# Patient Record
Sex: Female | Born: 1990 | Race: Black or African American | Hispanic: No | Marital: Married | Smoking: Never smoker
Health system: Southern US, Community
[De-identification: ages and names within clinical notes are randomized; demographics above are authoritative.]

## PROBLEM LIST (undated history)

## (undated) DIAGNOSIS — O24419 Gestational diabetes mellitus in pregnancy, unspecified control: Secondary | ICD-10-CM

## (undated) DIAGNOSIS — I82409 Acute embolism and thrombosis of unspecified deep veins of unspecified lower extremity: Secondary | ICD-10-CM

## (undated) DIAGNOSIS — K439 Ventral hernia without obstruction or gangrene: Secondary | ICD-10-CM

## (undated) DIAGNOSIS — D649 Anemia, unspecified: Secondary | ICD-10-CM

## (undated) HISTORY — DX: Gestational diabetes mellitus in pregnancy, unspecified control: O24.419

## (undated) HISTORY — PX: WISDOM TOOTH EXTRACTION: SHX21

## (undated) HISTORY — PX: POLYPECTOMY: SHX149

---
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
## 2015-07-18 DIAGNOSIS — G43009 Migraine without aura, not intractable, without status migrainosus: Secondary | ICD-10-CM | POA: Insufficient documentation

## 2017-02-04 ENCOUNTER — Ambulatory Visit (INDEPENDENT_AMBULATORY_CARE_PROVIDER_SITE_OTHER): Payer: PRIVATE HEALTH INSURANCE | Admitting: Physician Assistant

## 2017-02-04 ENCOUNTER — Encounter: Payer: Self-pay | Admitting: Physician Assistant

## 2017-02-04 VITALS — BP 110/70 | HR 80 | Temp 98.0°F | Resp 18 | Ht 68.11 in | Wt 164.0 lb

## 2017-02-04 DIAGNOSIS — Z7689 Persons encountering health services in other specified circumstances: Secondary | ICD-10-CM | POA: Diagnosis not present

## 2017-02-04 DIAGNOSIS — Z87898 Personal history of other specified conditions: Secondary | ICD-10-CM

## 2017-02-04 NOTE — Patient Instructions (Addendum)
CBC - to check for anemia TSH - to check for thyroid problems CMET - to check your liver and kideys and electrolytes    IF you received an x-ray today, you will receive an invoice from [facility]. Please contact [facility] at [phone] with questions or concerns regarding your invoice.   IF you received labwork today, you will receive an invoice from [facility]. Please contact [facility] at [phone] with questions or concerns regarding your invoice.   Our billing staff will not be able to assist you with questions regarding bills from these companies.  You will be contacted with the lab results as soon as they are available. The fastest way to get your results is to activate your My Chart account. Instructions are located on the last page of this paperwork. If you have not heard from us regarding the results in 2 weeks, please contact this office.

## 2017-02-04 NOTE — Progress Notes (Signed)
   Tina ReedSarah Phillips  MRN: [id number] DOB: 06/24/1991  PCP: Patient, No Pcp Per  Chief Complaint  Patient presents with  . Establish Care    Subjective:  Pt presents to clinic to establish care.  She tried to give plasma in Jan and on 3 separate occasions she had tachycardia and was told by them that she had to be evaluated before she could give more plasma. She feels fine.  She has not tachycardia or palpitations that she is aware of.  During that time in jan she was under a lot of stress with the death of her sisters father.  She also recognizes that after she was told the 1st time her HR was elevated when she went the 2nd time she was upset that it might be high again and it was and then it happened a 3rd time.   Review of Systems  Patient Active Problem List   Diagnosis Date Noted  . Migraine without aura 07/18/2015    No current outpatient prescriptions on file prior to visit.   No current facility-administered medications on file prior to visit.     No Known Allergies  Pt patients past, family and social history were reviewed and updated.   Objective:  BP 110/70   Pulse 80   Temp 98 F (36.7 C) (Oral)   Resp 18   Ht 5' 8.11" (1.73 m)   Wt 164 lb (74.4 kg)   LMP 01/17/2017 (Approximate)   SpO2 98%   BMI 24.86 kg/m   Physical Exam  Constitutional: She is oriented to person, place, and time and well-developed, well-nourished, and in no distress.  HENT:  Head: Normocephalic and atraumatic.  Right Ear: Hearing and external ear normal.  Left Ear: Hearing and external ear normal.  Eyes: Conjunctivae are normal.  Neck: Normal range of motion.  Cardiovascular: Normal rate, regular rhythm and normal heart sounds.   No murmur heard. Pulmonary/Chest: Effort normal and breath sounds normal. She has no wheezes.  Neurological: She is alert and oriented to person, place, and time. Gait normal.  Skin: Skin is warm and dry.  Psychiatric: Mood, memory, affect and judgment  normal.  Vitals reviewed.   Assessment and Plan :  Encounter to establish care  History of tachycardia - pt HR is normal today - it was checked twice.  A note was written to plasma center - she will let me know if they will not accept this and we will do lab work at that time without an ov.    Benny LennertSarah Zylpha Poynor PA-C  Primary Care at [facility] 02/04/2017 4:40 PM

## 2017-03-07 ENCOUNTER — Encounter: Payer: PRIVATE HEALTH INSURANCE | Admitting: Physician Assistant

## 2017-04-01 ENCOUNTER — Encounter (HOSPITAL_COMMUNITY): Payer: Self-pay | Admitting: Emergency Medicine

## 2017-04-01 ENCOUNTER — Emergency Department (HOSPITAL_COMMUNITY)
Admission: EM | Admit: 2017-04-01 | Discharge: 2017-04-01 | Disposition: A | Discharge status: Home / Self Care | Payer: Self-pay | Attending: Emergency Medicine | Admitting: Emergency Medicine

## 2017-04-01 DIAGNOSIS — F10920 Alcohol use, unspecified with intoxication, uncomplicated: Secondary | ICD-10-CM | POA: Insufficient documentation

## 2017-04-01 DIAGNOSIS — Z79899 Other long term (current) drug therapy: Secondary | ICD-10-CM | POA: Insufficient documentation

## 2017-04-01 DIAGNOSIS — R1110 Vomiting, unspecified: Secondary | ICD-10-CM | POA: Insufficient documentation

## 2017-04-01 DIAGNOSIS — R4182 Altered mental status, unspecified: Secondary | ICD-10-CM | POA: Insufficient documentation

## 2017-04-01 DIAGNOSIS — K2920 Alcoholic gastritis without bleeding: Secondary | ICD-10-CM

## 2017-04-01 LAB — COMPREHENSIVE METABOLIC PANEL
ALBUMIN: 3.4 g/dL — AB (ref 3.5–5.0)
ALT: 46 U/L (ref 14–54)
ANION GAP: 7 (ref 5–15)
AST: 34 U/L (ref 15–41)
Alkaline Phosphatase: 32 U/L — ABNORMAL LOW (ref 38–126)
BILIRUBIN TOTAL: 0.2 mg/dL — AB (ref 0.3–1.2)
BUN: 11 mg/dL (ref 6–20)
CALCIUM: 7.5 mg/dL — AB (ref 8.9–10.3)
CO2: 21 mmol/L — ABNORMAL LOW (ref 22–32)
Chloride: 111 mmol/L (ref 101–111)
Creatinine, Ser: 0.8 mg/dL (ref 0.44–1.00)
GLUCOSE: 104 mg/dL — AB (ref 65–99)
POTASSIUM: 3.7 mmol/L (ref 3.5–5.1)
Sodium: 139 mmol/L (ref 135–145)
TOTAL PROTEIN: 6 g/dL — AB (ref 6.5–8.1)

## 2017-04-01 LAB — URINALYSIS, ROUTINE W REFLEX MICROSCOPIC
Bacteria, UA: NONE SEEN
Bilirubin Urine: NEGATIVE
GLUCOSE, UA: NEGATIVE mg/dL
HGB URINE DIPSTICK: NEGATIVE
KETONES UR: NEGATIVE mg/dL
Leukocytes, UA: NEGATIVE
NITRITE: NEGATIVE
PH: 6 (ref 5.0–8.0)
Protein, ur: NEGATIVE mg/dL
Specific Gravity, Urine: 1.01 (ref 1.005–1.030)

## 2017-04-01 LAB — RAPID URINE DRUG SCREEN, HOSP PERFORMED
Amphetamines: NOT DETECTED
BARBITURATES: NOT DETECTED
BENZODIAZEPINES: NOT DETECTED
Cocaine: NOT DETECTED
Opiates: NOT DETECTED
Tetrahydrocannabinol: NOT DETECTED

## 2017-04-01 LAB — I-STAT BETA HCG BLOOD, ED (MC, WL, AP ONLY): I-stat hCG, quantitative: <5 m[IU]/mL (ref <5)

## 2017-04-01 LAB — CBC WITH DIFFERENTIAL/PLATELET
BASOS PCT: 0 %
Basophils Absolute: 0 10*3/uL (ref 0.0–0.1)
Eosinophils Absolute: 0 10*3/uL (ref 0.0–0.7)
Eosinophils Relative: 0 %
HEMATOCRIT: 31.4 % — AB (ref 36.0–46.0)
Hemoglobin: 9.7 g/dL — ABNORMAL LOW (ref 12.0–15.0)
LYMPHS ABS: 1.3 10*3/uL (ref 0.7–4.0)
Lymphocytes Relative: 11 %
MCH: 24.6 pg — ABNORMAL LOW (ref 26.0–34.0)
MCHC: 30.9 g/dL (ref 30.0–36.0)
MCV: 79.5 fL (ref 78.0–100.0)
MONO ABS: 0.8 10*3/uL (ref 0.1–1.0)
MONOS PCT: 6 %
NEUTROS ABS: 9.6 10*3/uL — AB (ref 1.7–7.7)
Neutrophils Relative %: 83 %
Platelets: 218 10*3/uL (ref 150–400)
RBC: 3.95 MIL/uL (ref 3.87–5.11)
RDW: 16.9 % — AB (ref 11.5–15.5)
WBC: 11.7 10*3/uL — ABNORMAL HIGH (ref 4.0–10.5)

## 2017-04-01 LAB — ETHANOL: ALCOHOL ETHYL (B): 215 mg/dL — AB (ref <5)

## 2017-04-01 MED ORDER — FAMOTIDINE 20 MG PO TABS: 20.0000 mg | ORAL_TABLET | Freq: Two times a day (BID) | ORAL | 0 refills | Status: DC

## 2017-04-01 MED ORDER — SODIUM CHLORIDE 0.9 % IV BOLUS (SEPSIS)
1000.0000 mL | Freq: Once | INTRAVENOUS | Status: AC
  Administered 2017-04-01: 1000 mL via INTRAVENOUS

## 2017-04-01 MED ORDER — ONDANSETRON HCL 4 MG/2ML IJ SOLN
4.0000 mg | Freq: Once | INTRAMUSCULAR | Status: AC
  Administered 2017-04-01: 4 mg via INTRAVENOUS
  Filled 2017-04-01: qty 2

## 2017-04-01 NOTE — ED Notes (Signed)
soda and sandwich given to PT. RN at bedside

## 2017-04-01 NOTE — ED Provider Notes (Signed)
[facility]-EMERGENCY DEPT Provider Note   CSN: [id number] Arrival date & time: 04/01/17  0155     History   Chief Complaint Chief Complaint  Patient presents with  . Alcohol Intoxication    HPI Tina Phillips is a 26 y.o. female with unknown medical history presents to the emergency department via EMS with acute alcohol intoxication. EMS was called for intoxication. Patient had ambulated from the bar out to the parking lot where she began to vomit best prompting the 911 call. EMS reports that she was initially alert and oriented though slurring her words. Friends with the patient report no falls or trauma.  Per EMS, patient was able to ambulate with assistance to the stretcher where she sat down and vomited again.  After this, patient was largely unresponsive for EMS. They found her to be hypotensive and initiated fluid bolus.  The history is provided by the EMS personnel and medical records. The history is limited by the condition of the patient. No language interpreter was used.    History reviewed. No pertinent past medical history.  Patient Active Problem List   Diagnosis Date Noted  . Migraine without aura 07/18/2015    History reviewed. No pertinent surgical history.  OB History    No data available       Home Medications    Prior to Admission medications   Medication Sig Start Date End Date Taking? Authorizing Provider  Aspirin-Acetaminophen-Caffeine (GOODYS EXTRA STRENGTH) [phone] MG PACK Take 1 each by mouth as needed (headache).   Yes [provider]  Biotin 10 MG CAPS Take 1 capsule by mouth daily.    Yes [provider]  famotidine (PEPCID) 20 MG tablet Take 1 tablet (20 mg total) by mouth 2 (two) times daily. 04/01/17   Valda Christenson, Dahlia Client, PA-C    Family History Family History  Problem Relation Age of Onset  . Diabetes Mother   . Diabetes Father   . Hyperlipidemia Father   . Post-traumatic stress disorder Father   . Prostate cancer  Father     Social History Social History  Substance Use Topics  . Smoking status: Never Smoker  . Smokeless tobacco: Never Used  . Alcohol use No     Allergies   Patient has no known allergies.   Review of Systems Review of Systems  Unable to perform ROS: Mental status change     Physical Exam Updated Vital Signs BP 100/61   Pulse 84   Resp 17   SpO2 99%   Physical Exam  Constitutional: She appears well-developed and well-nourished. She appears lethargic. No distress.  Patient awakes with eye opening and grunting with noxious stimuli.  She states she is cold and asks for a blanket.  She answers no additional questions.  HENT:  Head: Normocephalic.  No contusions or evidence of trauma.  Eyes: Pupils are equal, round, and reactive to light. Conjunctivae are normal. No scleral icterus.  Neck: Normal range of motion. Neck supple.  Cardiovascular: Normal rate and intact distal pulses.   Pulses:      Radial pulses are 1+ on the right side, and 1+ on the left side.       Dorsalis pedis pulses are 1+ on the right side, and 1+ on the left side.  Pulmonary/Chest: Effort normal and breath sounds normal.  Abdominal: Soft.  Musculoskeletal: Normal range of motion.  Patient moves all 4 extremities independently and without ataxia  Neurological: She appears lethargic. GCS eye subscore is 2. GCS verbal subscore  is 4. GCS motor subscore is 5.  Skin: Skin is warm and dry.  No abrasions or lacerations noted  Nursing note and vitals reviewed.    ED Treatments / Results  Labs (all labs ordered are listed, but only abnormal results are displayed) Labs Reviewed  CBC WITH DIFFERENTIAL/PLATELET - Abnormal; Notable for the following:       Result Value   WBC 11.7 (*)    Hemoglobin 9.7 (*)    HCT 31.4 (*)    MCH 24.6 (*)    RDW 16.9 (*)    Neutro Abs 9.6 (*)    All other components within normal limits  COMPREHENSIVE METABOLIC PANEL - Abnormal; Notable for the following:    CO2  21 (*)    Glucose, Bld 104 (*)    Calcium 7.5 (*)    Total Protein 6.0 (*)    Albumin 3.4 (*)    Alkaline Phosphatase 32 (*)    Total Bilirubin 0.2 (*)    All other components within normal limits  ETHANOL - Abnormal; Notable for the following:    Alcohol, Ethyl (B) 215 (*)    All other components within normal limits  URINALYSIS, ROUTINE W REFLEX MICROSCOPIC - Abnormal; Notable for the following:    Color, Urine STRAW (*)    Squamous Epithelial / LPF 0-5 (*)    All other components within normal limits  RAPID URINE DRUG SCREEN, HOSP PERFORMED  I-STAT BETA HCG BLOOD, ED ([facility], [facility], [facility] ONLY)    Procedures Procedures (including critical care time)  Medications Ordered in ED Medications  sodium chloride 0.9 % bolus 1,000 mL (0 mLs Intravenous Stopped 04/01/17 0421)  ondansetron (ZOFRAN) injection 4 mg (4 mg Intravenous Given 04/01/17 0239)  sodium chloride 0.9 % bolus 1,000 mL (0 mLs Intravenous Stopped 04/01/17 [id number])     Initial Impression / Assessment and Plan / ED Course  I have reviewed the triage vital signs and the nursing notes.  Pertinent labs & imaging results that were available during my care of the patient were reviewed by me and considered in my medical decision making (see chart for details).  Clinical Course as of Apr 01 645  Fri Apr 01, 2017  [id number] Pt is now more awake and reports she has had several shots tonight.  She reports she remembers a friend vomiting in the bathroom, but does not remember walking outside or vomiting.    [HM]  0522 BP remains soft.  Will give additional fluids and allow pt to eat/drink.   BP: (!) 98/57 [HM]  [id number] Pt is able to walk to the bathroom.  She has eaten a sandwich and had some fluids without emesis.  She is able to answer orientation questions.    Discussed discharge home with mother who reports she is comfortable with this and will be able to watch the patient at home.    [HM]  0635 BP has improved some.  Pt is able to stand without  feeling lightheaded.   BP: 100/61 [HM]    Clinical Course User Index [HM] Ayrabella Labombard, Dahlia ClientHannah, PA-C    Patient presents with acute alcohol intoxication. Mild anemia is noted patient reports history of same. Mild leukocytosis is also noted. Patient has been given fluids. Drug screen is negative. UA without evidence of urinary tract infection. Patient's blood pressure and mental status have improved. She is now alert and oriented 4. She is able to ambulate to the bathroom and has tolerated by mouth without emesis. Patient's mother  is at bedside. Patient and mother state they are comfortable with discharge home at this time. She is well appearing.   Final Clinical Impressions(s) / ED Diagnoses   Final diagnoses:  Alcoholic intoxication without complication (HCC)    New Prescriptions New Prescriptions   FAMOTIDINE (PEPCID) 20 MG TABLET    Take 1 tablet (20 mg total) by mouth 2 (two) times daily.     Isley Weisheit, Boyd Kerbs 04/01/17 4098    Ward, Layla Maw, DO 04/01/17 1191

## 2017-04-01 NOTE — ED Notes (Signed)
Boyfriend at bedside. This Clinical research associatewriter attempt to ambulate to restroom. PT unable to stand and walk at this time. Unable to keep balance.

## 2017-04-01 NOTE — ED Triage Notes (Signed)
Patient BIB [facility] [place] [facility]. Patient alert and responsive on EMS arrival, vomited a large amount and then would not respond to voice. Patient hypotensive, 600ml ns given.

## 2017-04-01 NOTE — ED Notes (Signed)
Patient ambulatory to bathroom with assist.

## 2017-04-01 NOTE — Discharge Instructions (Signed)
1. Medications: pepcid for alcoholic gastritis symptoms, usual home medications 2. Treatment: rest, drink plenty of fluids,  3. Follow Up: Please followup with your primary doctor in 2-3 days for discussion of your diagnoses and further evaluation after today's visit; if you do not have a primary care doctor use the resource guide provided to find one; Please return to the ER for lethargy, persistent vomiting, fever or other concerns

## 2017-08-23 DIAGNOSIS — I82409 Acute embolism and thrombosis of unspecified deep veins of unspecified lower extremity: Secondary | ICD-10-CM

## 2017-08-23 HISTORY — DX: Acute embolism and thrombosis of unspecified deep veins of unspecified lower extremity: I82.409

## 2017-11-16 ENCOUNTER — Encounter (HOSPITAL_COMMUNITY): Payer: Self-pay | Admitting: *Deleted

## 2017-11-16 DIAGNOSIS — K439 Ventral hernia without obstruction or gangrene: Secondary | ICD-10-CM | POA: Insufficient documentation

## 2017-11-16 NOTE — ED Triage Notes (Signed)
Pt complains of upper abdominal pain. Pt states she has had hernia for the past 2 month. Pt denies n/v/d.

## 2017-11-17 ENCOUNTER — Emergency Department (HOSPITAL_COMMUNITY)
Admission: EM | Admit: 2017-11-17 | Discharge: 2017-11-17 | Disposition: A | Discharge status: Home / Self Care | Payer: Self-pay | Attending: Emergency Medicine | Admitting: Emergency Medicine

## 2017-11-17 DIAGNOSIS — K439 Ventral hernia without obstruction or gangrene: Secondary | ICD-10-CM

## 2017-11-17 NOTE — Discharge Instructions (Addendum)
Please see the surgeons as requested.

## 2017-11-17 NOTE — ED Provider Notes (Signed)
[facility]-EMERGENCY DEPT Provider Note   CSN: [id number] Arrival date & time: 11/16/17  1839     History   Chief Complaint Chief Complaint  Patient presents with  . Hernia    HPI Tina Phillips is a 27 y.o. female.  HPI  27 year old female comes in with chief complaint of abdominal pain.  Patient has known history of ventral hernia.  She has been having intermittent abdominal pain for the last several days, last night she was unable to sleep and decided to come to the ER.  During my evaluation patient's abdominal pain has improved significantly.  At no point did she have any nausea, vomiting, diarrhea.  Patient has no other medical problems and she denies any gynecologic pathology either.  Review of system is negative for any UTI-like symptoms.  History reviewed. No pertinent past medical history.  Patient Active Problem List   Diagnosis Date Noted  . Migraine without aura 07/18/2015    History reviewed. No pertinent surgical history.   OB History   None      Home Medications    Prior to Admission medications   Medication Sig Start Date End Date Taking? Authorizing Provider  aspirin-acetaminophen-caffeine (EXCEDRIN MIGRAINE) [phone] MG tablet Take 1 tablet by mouth every 6 (six) hours as needed for headache.   Yes [provider]  Multiple Vitamin (MULTIVITAMIN WITH MINERALS) TABS tablet Take 1 tablet by mouth daily.   Yes [provider]  famotidine (PEPCID) 20 MG tablet Take 1 tablet (20 mg total) by mouth 2 (two) times daily. Patient not taking: Reported on 11/17/2017 04/01/17   Muthersbaugh, Dahlia ClientHannah, PA-C    Family History Family History  Problem Relation Age of Onset  . Diabetes Mother   . Diabetes Father   . Hyperlipidemia Father   . Post-traumatic stress disorder Father   . Prostate cancer Father     Social History Social History   Tobacco Use  . Smoking status: Never Smoker  . Smokeless tobacco: Never Used    Substance Use Topics  . Alcohol use: No  . Drug use: No     Allergies   Patient has no known allergies.   Review of Systems Review of Systems  Constitutional: Positive for activity change.  Cardiovascular: Negative for chest pain.  Gastrointestinal: Positive for abdominal pain. Negative for nausea and vomiting.  Genitourinary: Negative for dysuria and flank pain.     Physical Exam Updated Vital Signs BP 120/80 (BP Location: Left Arm)   Pulse 86   Temp 98.4 F (36.9 C) (Oral)   Resp 18   Wt 81.6 kg (180 lb)   LMP 11/15/2017   SpO2 99%   BMI 27.28 kg/m   Physical Exam  Constitutional: She is oriented to person, place, and time. She appears well-developed.  HENT:  Head: Normocephalic and atraumatic.  Eyes: EOM are normal.  Neck: Normal range of motion. Neck supple.  Cardiovascular: Normal rate.  Pulmonary/Chest: Effort normal.  Abdominal: Bowel sounds are normal. She exhibits no mass. There is tenderness. There is no rebound and no guarding. No hernia.  Periumbilical tenderness.  Neurological: She is alert and oriented to person, place, and time.  Skin: Skin is warm and dry.  Nursing note and vitals reviewed.    ED Treatments / Results  Labs (all labs ordered are listed, but only abnormal results are displayed) Labs Reviewed - No data to display  EKG None  Radiology No results found.  Procedures Procedures (including critical care time)  Medications Ordered in ED Medications - No data to display   Initial Impression / Assessment and Plan / ED Course  I have reviewed the triage vital signs and the nursing notes.  Pertinent labs & imaging results that were available during my care of the patient were reviewed by me and considered in my medical decision making (see chart for details).  Clinical Course as of Nov 17 2320  Thu Nov 17, 2017  0321  IMPRESSION: 1. Small supraumbilical midline ventral hernia underlying the marker used by the  technologist to identified the region of patient concern. Ventral hernia contains only fat and no fluid or edema within the hernia sac to suggest fatty incarceration.   [AN]    Clinical Course User Index [AN] Derwood Kaplan, MD    27 year old female with history of ventral hernia comes in with chief complaint of abdominal pain.  Patient has no peritoneal signs on my exam, and the hernia is not easily discernible.  CT scan from outside hospital reviewed, and patient did have a ventral hernia.  Patient states that her pain is consistent with her prior hernia pain.  Does not seem like there is any incarceration of hernia at this time, therefore no need for emergency CT scan.  Patient's pain has significantly improved, we will discharge her with general surgery follow-up.  Final Clinical Impressions(s) / ED Diagnoses   Final diagnoses:  Ventral hernia without obstruction or gangrene    ED Discharge Orders    None       Derwood Kaplan, MD 11/17/17 2325

## 2017-11-23 ENCOUNTER — Ambulatory Visit: Payer: Self-pay | Admitting: Surgery

## 2017-11-23 NOTE — H&P (Signed)
  Tina Phillips Documented: 11/23/2017 10:44 AM Location: [facility] Patient #: [id number] DOB: 1990/09/28 Undefined / Language: [place] / Race: Black or African American Female  History of Present Illness (Tina Hentz A. Tina Bonineonnor MD; 11/23/2017 11:06 AM) Patient words: 27 year old otherwise healthy woman with a supraumbilical hernia. Had been [facility] Claudine [facility] and scheduled for surgery a week from today for robotic hernia repair but has decided not to pursue surgery there. here with her mohter, Tina Phillips Her workup has included a CT scan which demonstrates a 2.6 x 0.9 cm supraumbilical midline hernia containing fat, as well as an ultrasound that is negative for gallstones or gallbladder wall thickening according to review of Dr. Benson Phillips's notes in care everywhere (image is not available for my review). She describes pain just above the umbilicus which is pulling and quality, exacerbated by physical activity or stretching. She does endorse a history of belching as well as upper abdominal pain after eating spicy foods.  No prior abdominal surgeries. No medical problems. No allergies. Family history noncontributory.  She works in [employer], nonsmoker.  The patient is a 27 year old female.   Allergies [name](Tina Phillips, Phillips; 11/23/2017 10:44 AM) No Known Allergies [11/23/2017]: Allergies Reconciled  Medication History Tina Phillips(Tina Phillips, Phillips; 11/23/2017 10:45 AM) Multiple Vitamin (Oral) Active. Medications Reconciled     Review of Systems (Tina Tina Phillips A. Tina Bonineonnor MD; 11/23/2017 11:06 AM) All other systems negative  Vitals (Tina Phillips; 11/23/2017 10:45 AM) 11/23/2017 10:45 AM Weight: 179.5 lb Height: 67in Body Surface Area: 1.93 m Body Mass Index: 28.11 kg/m  Temp.: 98.23F(Oral)  Pulse: 84 (Regular)  BP: 122/90 (Sitting, Right Arm, Standard)      Physical Exam (Tina Phillips A. Tina Bonineonnor MD; 11/23/2017 11:04 AM)  The physical exam findings are as  follows: Note:Gen: alert and well appearing Eye: extraocular motion intact, no scleral icterus ENT: moist mucus membranes, dentition intact Neck: no mass or thyromegaly Chest: unlabored respirations, symmetrical air entry, clear bilaterally CV: regular rate and rhythm, no pedal edema Abdomen: soft, nontender, nondistended. No mass or organomegaly. There is a vaguely palpable fascial defect just superior to the umbilicus with no herniated structures. The area is focally tender. MSK: strength symmetrical throughout, no deformity Neuro: grossly intact, normal gait Psych: normal mood and affect, appropriate insight Skin: warm and dry, no rash or lesion on limited exam    Assessment & Plan (Tina Tina Phillips A. Tina Bonineonnor MD; 11/23/2017 11:05 AM)  VENTRAL HERNIA (K43.9) Story: Discussed open versus laparoscopic repair. I recommended laparoscopic and we discussed the risks of surgery including bleeding, infection, pain, scarring, injury to intra-abdominal structures, hernia recurrence. Discussed use of mesh. Also discussed that the epigastric pain and belching are unlikely to be resolved from hernia repair. She expressed understanding. Questions were welcomed and answered. She would like to proceed with laparoscopic hernia repair with mesh.

## 2017-12-20 NOTE — Pre-Procedure Instructions (Signed)
Tina Phillips  12/20/2017      [facility] - [place] ([place]), [place] - [address] [place] ([place]) [place] [address] Phone: [phone] Fax: [phone]    Your procedure is scheduled on Fri., Dec 30, 2017  Report to [facility] Admitting Entrance "A" at 11:30AM  Call this number if you have problems the morning of surgery:  [phone]   Remember:  Do not eat food or drink liquids after midnight.  Take these medicines the morning of surgery with A SIP OF WATER: NONE  7 days before surgery (5/3), stop taking all Aspirins, Vitamins, Fish oils, and Herbal medications. Also stop all NSAIDS i.e. Advil, Ibuprofen, Motrin, Aleve, Anaprox, Naproxen, BC and Goody Powders.Including: Excedrin Migraine   Do not wear jewelry, make-up or nail polish.  Do not wear lotions, powders,  perfumes, or deodorant.  Do not shave 48 hours prior to surgery.    Do not bring valuables to the hospital.  [facility] is not responsible for any belongings or valuables.  Contacts, dentures or bridgework may not be worn into surgery.  Leave your suitcase in the car.  After surgery it may be brought to your room.  For patients admitted to the hospital, discharge time will be determined by your treatment team.  Patients discharged the day of surgery will not be allowed to drive home.   Special instructions:   [facility]- Preparing For Surgery  Before surgery, you can play an important role. Because skin is not sterile, your skin needs to be as free of germs as possible. You can reduce the number of germs on your skin by washing with CHG (chlorahexidine gluconate) Soap before surgery.  CHG is an antiseptic cleaner which kills germs and bonds with the skin to continue killing germs even after washing.  Please do not use if you have an allergy to CHG or antibacterial soaps. If your skin becomes reddened/irritated stop using the CHG.  Do not shave (including legs and  underarms) for at least 48 hours prior to first CHG shower. It is OK to shave your face.  Please follow these instructions carefully.   1. Shower the NIGHT BEFORE SURGERY and the MORNING OF SURGERY with CHG.   2. If you chose to wash your hair, wash your hair first as usual with your normal shampoo.  3. After you shampoo, rinse your hair and body thoroughly to remove the shampoo.  4. Use CHG as you would any other liquid soap. You can apply CHG directly to the skin and wash gently with a scrungie or a clean washcloth.   5. Apply the CHG Soap to your body ONLY FROM THE NECK DOWN.  Do not use on open wounds or open sores. Avoid contact with your eyes, ears, mouth and genitals (private parts). Wash Face and genitals (private parts)  with your normal soap.  6. Wash thoroughly, paying special attention to the area where your surgery will be performed.  7. Thoroughly rinse your body with warm water from the neck down.  8. DO NOT shower/wash with your normal soap after using and rinsing off the CHG Soap.  9. Pat yourself dry with a CLEAN TOWEL.  10. Wear CLEAN PAJAMAS to bed the night before surgery, wear comfortable clothes the morning of surgery  11. Place CLEAN SHEETS on your bed the night of your first shower and DO NOT SLEEP WITH PETS.  Day of Surgery: Do not apply any  deodorants/lotions. Please wear clean clothes to the hospital/surgery center.    Please read over the following fact sheets that you were given. Pain Booklet, Coughing and Deep Breathing and Surgical Site Infection Prevention

## 2017-12-21 ENCOUNTER — Other Ambulatory Visit: Payer: Self-pay

## 2017-12-21 ENCOUNTER — Encounter (HOSPITAL_COMMUNITY)
Admission: RE | Admit: 2017-12-21 | Discharge: 2017-12-21 | Disposition: A | Discharge status: Home / Self Care | Payer: Medicaid Other | Source: Ambulatory Visit | Attending: Surgery | Admitting: Surgery

## 2017-12-21 ENCOUNTER — Encounter (HOSPITAL_COMMUNITY): Payer: Self-pay

## 2017-12-21 DIAGNOSIS — Z01812 Encounter for preprocedural laboratory examination: Secondary | ICD-10-CM | POA: Insufficient documentation

## 2017-12-21 HISTORY — DX: Ventral hernia without obstruction or gangrene: K43.9

## 2017-12-21 HISTORY — DX: Anemia, unspecified: D64.9

## 2017-12-21 LAB — BASIC METABOLIC PANEL
Anion gap: 8 (ref 5–15)
BUN: 8 mg/dL (ref 6–20)
CHLORIDE: 105 mmol/L (ref 101–111)
CO2: 24 mmol/L (ref 22–32)
CREATININE: 0.89 mg/dL (ref 0.44–1.00)
Calcium: 9 mg/dL (ref 8.9–10.3)
GFR calc Af Amer: >60 mL/min (ref >60)
GFR calc non Af Amer: >60 mL/min (ref >60)
Glucose, Bld: 99 mg/dL (ref 65–99)
Potassium: 4 mmol/L (ref 3.5–5.1)
SODIUM: 137 mmol/L (ref 135–145)

## 2017-12-21 LAB — CBC WITH DIFFERENTIAL/PLATELET
Basophils Absolute: 0 10*3/uL (ref 0.0–0.1)
Basophils Relative: 0 %
EOS ABS: 0.1 10*3/uL (ref 0.0–0.7)
Eosinophils Relative: 2 %
HCT: 36 % (ref 36.0–46.0)
Hemoglobin: 11.2 g/dL — ABNORMAL LOW (ref 12.0–15.0)
LYMPHS ABS: 2.6 10*3/uL (ref 0.7–4.0)
Lymphocytes Relative: 36 %
MCH: 27.2 pg (ref 26.0–34.0)
MCHC: 31.1 g/dL (ref 30.0–36.0)
MCV: 87.4 fL (ref 78.0–100.0)
Monocytes Absolute: 0.8 10*3/uL (ref 0.1–1.0)
Monocytes Relative: 11 %
Neutro Abs: 3.8 10*3/uL (ref 1.7–7.7)
Neutrophils Relative %: 51 %
PLATELETS: 268 10*3/uL (ref 150–400)
RBC: 4.12 MIL/uL (ref 3.87–5.11)
RDW: 14.2 % (ref 11.5–15.5)
WBC: 7.3 10*3/uL (ref 4.0–10.5)

## 2017-12-21 NOTE — Progress Notes (Signed)
PCP - Denies  Cardiologist - Denies  Chest x-ray - Denies  EKG - Denies  Stress Test - Denies  ECHO - Denies  Cardiac Cath - Denies  Sleep Study - Denies CPAP - None  LABS- 12/21/17: CBC w/D, BMP POC Upreg- 12/30/17  Anesthesia- No  Pt denies having chest pain, sob, or fever at this time. All instructions explained to the pt, with a verbal understanding of the material. Pt agrees to go over the instructions while at home for a better understanding. The opportunity to ask questions was provided.

## 2017-12-30 ENCOUNTER — Ambulatory Visit (HOSPITAL_COMMUNITY): Payer: Self-pay | Admitting: Certified Registered Nurse Anesthetist

## 2017-12-30 ENCOUNTER — Encounter (HOSPITAL_COMMUNITY)
Admission: RE | Disposition: A | Discharge status: Home / Self Care | Payer: Self-pay | Source: Ambulatory Visit | Attending: Surgery

## 2017-12-30 ENCOUNTER — Encounter (HOSPITAL_COMMUNITY): Payer: Self-pay

## 2017-12-30 ENCOUNTER — Ambulatory Visit (HOSPITAL_COMMUNITY)
Admission: RE | Admit: 2017-12-30 | Discharge: 2017-12-30 | Disposition: A | Discharge status: Home / Self Care | Payer: Self-pay | Source: Ambulatory Visit | Attending: Surgery | Admitting: Surgery

## 2017-12-30 DIAGNOSIS — K436 Other and unspecified ventral hernia with obstruction, without gangrene: Secondary | ICD-10-CM | POA: Insufficient documentation

## 2017-12-30 HISTORY — PX: VENTRAL HERNIA REPAIR: SHX424

## 2017-12-30 HISTORY — PX: INSERTION OF MESH: SHX5868

## 2017-12-30 LAB — POCT PREGNANCY, URINE: PREG TEST UR: NEGATIVE

## 2017-12-30 SURGERY — REPAIR, HERNIA, VENTRAL, LAPAROSCOPIC
Anesthesia: General | Site: Abdomen

## 2017-12-30 MED ORDER — DEXAMETHASONE SODIUM PHOSPHATE 10 MG/ML IJ SOLN
INTRAMUSCULAR | Status: AC
  Filled 2017-12-30: qty 1

## 2017-12-30 MED ORDER — OXYCODONE HCL 5 MG PO TABS
5.0000 mg | ORAL_TABLET | ORAL | Status: DC | PRN
  Administered 2017-12-30: 10 mg via ORAL

## 2017-12-30 MED ORDER — CELECOXIB 200 MG PO CAPS
200.0000 mg | ORAL_CAPSULE | ORAL | Status: AC
  Administered 2017-12-30: 200 mg via ORAL
  Filled 2017-12-30: qty 1

## 2017-12-30 MED ORDER — HYDROCODONE-ACETAMINOPHEN 7.5-325 MG PO TABS
1.0000 | ORAL_TABLET | Freq: Once | ORAL | Status: DC | PRN
Start: 2017-12-30 — End: 2017-12-30

## 2017-12-30 MED ORDER — HYDROMORPHONE HCL 2 MG/ML IJ SOLN
0.2500 mg | INTRAMUSCULAR | Status: DC | PRN
  Administered 2017-12-30 (×2): 0.5 mg via INTRAVENOUS

## 2017-12-30 MED ORDER — FENTANYL CITRATE (PF) 250 MCG/5ML IJ SOLN
INTRAMUSCULAR | Status: AC
  Filled 2017-12-30: qty 5

## 2017-12-30 MED ORDER — CHLORHEXIDINE GLUCONATE 4 % EX LIQD: 60.0000 mL | Freq: Once | CUTANEOUS | Status: DC

## 2017-12-30 MED ORDER — OXYCODONE-ACETAMINOPHEN 5-325 MG PO TABS: 1.0000 | ORAL_TABLET | Freq: Four times a day (QID) | ORAL | 0 refills | Status: DC | PRN

## 2017-12-30 MED ORDER — ROCURONIUM BROMIDE 50 MG/5ML IV SOLN
INTRAVENOUS | Status: AC
  Filled 2017-12-30: qty 1

## 2017-12-30 MED ORDER — SODIUM CHLORIDE 0.9% FLUSH: 3.0000 mL | INTRAVENOUS | Status: DC | PRN

## 2017-12-30 MED ORDER — ROCURONIUM BROMIDE 100 MG/10ML IV SOLN
INTRAVENOUS | Status: DC | PRN
  Administered 2017-12-30: 10 mg via INTRAVENOUS
  Administered 2017-12-30: 40 mg via INTRAVENOUS
  Administered 2017-12-30: 10 mg via INTRAVENOUS

## 2017-12-30 MED ORDER — CEFAZOLIN SODIUM-DEXTROSE 2-4 GM/100ML-% IV SOLN
2.0000 g | INTRAVENOUS | Status: AC
  Administered 2017-12-30: 2 g via INTRAVENOUS
  Filled 2017-12-30: qty 100

## 2017-12-30 MED ORDER — DEXAMETHASONE SODIUM PHOSPHATE 10 MG/ML IJ SOLN
INTRAMUSCULAR | Status: DC | PRN
  Administered 2017-12-30: 10 mg via INTRAVENOUS

## 2017-12-30 MED ORDER — PROMETHAZINE HCL 25 MG/ML IJ SOLN: 6.2500 mg | INTRAMUSCULAR | Status: DC | PRN

## 2017-12-30 MED ORDER — OXYCODONE HCL 5 MG PO TABS
ORAL_TABLET | ORAL | Status: AC
  Filled 2017-12-30: qty 2

## 2017-12-30 MED ORDER — SODIUM CHLORIDE 0.9% FLUSH: 3.0000 mL | Freq: Two times a day (BID) | INTRAVENOUS | Status: DC

## 2017-12-30 MED ORDER — BUPIVACAINE-EPINEPHRINE (PF) 0.25% -1:200000 IJ SOLN
INTRAMUSCULAR | Status: AC
  Filled 2017-12-30: qty 30

## 2017-12-30 MED ORDER — MEPERIDINE HCL 50 MG/ML IJ SOLN: 6.2500 mg | INTRAMUSCULAR | Status: DC | PRN

## 2017-12-30 MED ORDER — SODIUM CHLORIDE 0.9 % IV SOLN: 250.0000 mL | INTRAVENOUS | Status: DC | PRN

## 2017-12-30 MED ORDER — GABAPENTIN 300 MG PO CAPS
300.0000 mg | ORAL_CAPSULE | ORAL | Status: AC
  Administered 2017-12-30: 300 mg via ORAL
  Filled 2017-12-30: qty 1

## 2017-12-30 MED ORDER — ACETAMINOPHEN 10 MG/ML IV SOLN
1000.0000 mg | Freq: Once | INTRAVENOUS | Status: DC | PRN
  Administered 2017-12-30: 1000 mg via INTRAVENOUS

## 2017-12-30 MED ORDER — LACTATED RINGERS IV SOLN
INTRAVENOUS | Status: DC | PRN
  Administered 2017-12-30 (×2): via INTRAVENOUS

## 2017-12-30 MED ORDER — ACETAMINOPHEN 325 MG PO TABS: 650.0000 mg | ORAL_TABLET | ORAL | Status: DC | PRN

## 2017-12-30 MED ORDER — FENTANYL CITRATE (PF) 100 MCG/2ML IJ SOLN
INTRAMUSCULAR | Status: DC | PRN
  Administered 2017-12-30: 50 ug via INTRAVENOUS
  Administered 2017-12-30: 150 ug via INTRAVENOUS
  Administered 2017-12-30 (×2): 50 ug via INTRAVENOUS

## 2017-12-30 MED ORDER — LIDOCAINE 2% (20 MG/ML) 5 ML SYRINGE
INTRAMUSCULAR | Status: DC | PRN
  Administered 2017-12-30: 100 mg via INTRAVENOUS

## 2017-12-30 MED ORDER — DOCUSATE SODIUM 100 MG PO CAPS: 100.0000 mg | ORAL_CAPSULE | Freq: Two times a day (BID) | ORAL | 0 refills | Status: AC

## 2017-12-30 MED ORDER — MIDAZOLAM HCL 2 MG/2ML IJ SOLN
INTRAMUSCULAR | Status: AC
  Filled 2017-12-30: qty 2

## 2017-12-30 MED ORDER — BUPIVACAINE-EPINEPHRINE 0.25% -1:200000 IJ SOLN
INTRAMUSCULAR | Status: DC | PRN
  Administered 2017-12-30: 10 mL

## 2017-12-30 MED ORDER — ACETAMINOPHEN 500 MG PO TABS
1000.0000 mg | ORAL_TABLET | ORAL | Status: AC
  Administered 2017-12-30: 1000 mg via ORAL
  Filled 2017-12-30: qty 2

## 2017-12-30 MED ORDER — PROPOFOL 10 MG/ML IV BOLUS
INTRAVENOUS | Status: AC
  Filled 2017-12-30: qty 20

## 2017-12-30 MED ORDER — SUGAMMADEX SODIUM 200 MG/2ML IV SOLN
INTRAVENOUS | Status: AC
  Filled 2017-12-30: qty 2

## 2017-12-30 MED ORDER — SUGAMMADEX SODIUM 200 MG/2ML IV SOLN
INTRAVENOUS | Status: DC | PRN
Start: 2017-12-30 — End: 2017-12-30
  Administered 2017-12-30: 160 mg via INTRAVENOUS

## 2017-12-30 MED ORDER — ONDANSETRON HCL 4 MG/2ML IJ SOLN
INTRAMUSCULAR | Status: DC | PRN
  Administered 2017-12-30: 4 mg via INTRAVENOUS

## 2017-12-30 MED ORDER — HYDROMORPHONE HCL 2 MG/ML IJ SOLN
INTRAMUSCULAR | Status: AC
  Filled 2017-12-30: qty 1

## 2017-12-30 MED ORDER — ACETAMINOPHEN 10 MG/ML IV SOLN
INTRAVENOUS | Status: AC
  Filled 2017-12-30: qty 100

## 2017-12-30 MED ORDER — MIDAZOLAM HCL 2 MG/2ML IJ SOLN
INTRAMUSCULAR | Status: DC | PRN
  Administered 2017-12-30: 2 mg via INTRAVENOUS

## 2017-12-30 MED ORDER — PROPOFOL 10 MG/ML IV BOLUS
INTRAVENOUS | Status: DC | PRN
  Administered 2017-12-30: 170 mg via INTRAVENOUS

## 2017-12-30 MED ORDER — FENTANYL CITRATE (PF) 100 MCG/2ML IJ SOLN
INTRAMUSCULAR | Status: AC
  Filled 2017-12-30: qty 2

## 2017-12-30 MED ORDER — LIDOCAINE 2% (20 MG/ML) 5 ML SYRINGE
INTRAMUSCULAR | Status: AC
  Filled 2017-12-30: qty 5

## 2017-12-30 MED ORDER — ONDANSETRON HCL 4 MG/2ML IJ SOLN
INTRAMUSCULAR | Status: AC
  Filled 2017-12-30: qty 4

## 2017-12-30 MED ORDER — ACETAMINOPHEN 650 MG RE SUPP: 650.0000 mg | RECTAL | Status: DC | PRN

## 2017-12-30 MED ORDER — FENTANYL CITRATE (PF) 100 MCG/2ML IJ SOLN
25.0000 ug | INTRAMUSCULAR | Status: DC | PRN
  Administered 2017-12-30 (×2): 25 ug via INTRAVENOUS

## 2017-12-30 MED ORDER — 0.9 % SODIUM CHLORIDE (POUR BTL) OPTIME
TOPICAL | Status: DC | PRN
  Administered 2017-12-30: 1000 mL

## 2017-12-30 SURGICAL SUPPLY — 51 items
APPLIER CLIP 5 13 M/L LIGAMAX5 (MISCELLANEOUS)
BINDER ABD UNIV 12 45-62 (WOUND CARE) IMPLANT
BINDER ABDOMINAL 46IN 62IN (WOUND CARE)
BINDER BREAST LRG (GAUZE/BANDAGES/DRESSINGS) ×4 IMPLANT
BLADE CLIPPER SURG (BLADE) ×4 IMPLANT
CANISTER SUCT 3000ML PPV (MISCELLANEOUS) ×4 IMPLANT
CHLORAPREP W/TINT 26ML (MISCELLANEOUS) ×4 IMPLANT
CLIP APPLIE 5 13 M/L LIGAMAX5 (MISCELLANEOUS) IMPLANT
COVER SURGICAL LIGHT HANDLE (MISCELLANEOUS) ×4 IMPLANT
DERMABOND ADVANCED (GAUZE/BANDAGES/DRESSINGS) ×2
DERMABOND ADVANCED .7 DNX12 (GAUZE/BANDAGES/DRESSINGS) ×2 IMPLANT
DEVICE PMI PUNCTURE CLOSURE (MISCELLANEOUS) ×4 IMPLANT
DEVICE SECURE STRAP 25 ABSORB (INSTRUMENTS) ×8 IMPLANT
DEVICE TROCAR PUNCTURE CLOSURE (ENDOMECHANICALS) IMPLANT
ELECT REM PT RETURN 9FT ADLT (ELECTROSURGICAL) ×4
ELECTRODE REM PT RTRN 9FT ADLT (ELECTROSURGICAL) ×2 IMPLANT
GLOVE BIO SURGEON STRL SZ 6 (GLOVE) ×4 IMPLANT
GLOVE BIOGEL PI IND STRL 6.5 (GLOVE) ×4 IMPLANT
GLOVE BIOGEL PI IND STRL 7.0 (GLOVE) ×4 IMPLANT
GLOVE BIOGEL PI IND STRL 7.5 (GLOVE) ×2 IMPLANT
GLOVE BIOGEL PI INDICATOR 6.5 (GLOVE) ×4
GLOVE BIOGEL PI INDICATOR 7.0 (GLOVE) ×4
GLOVE BIOGEL PI INDICATOR 7.5 (GLOVE) ×2
GLOVE ECLIPSE 6.0 STRL STRAW (GLOVE) ×4 IMPLANT
GLOVE ECLIPSE 7.5 STRL STRAW (GLOVE) ×4 IMPLANT
GOWN STRL REUS W/ TWL LRG LVL3 (GOWN DISPOSABLE) ×6 IMPLANT
GOWN STRL REUS W/TWL LRG LVL3 (GOWN DISPOSABLE) ×6
KIT BASIN OR (CUSTOM PROCEDURE TRAY) ×4 IMPLANT
KIT TURNOVER KIT B (KITS) ×4 IMPLANT
MARKER SKIN DUAL TIP RULER LAB (MISCELLANEOUS) ×4 IMPLANT
MESH VENTRALIGHT ST 4X6IN (Mesh General) ×4 IMPLANT
NEEDLE SPNL 22GX3.5 QUINCKE BK (NEEDLE) ×4 IMPLANT
NS IRRIG 1000ML POUR BTL (IV SOLUTION) ×4 IMPLANT
PAD ARMBOARD 7.5X6 YLW CONV (MISCELLANEOUS) ×8 IMPLANT
SCISSORS LAP 5X35 DISP (ENDOMECHANICALS) ×4 IMPLANT
SET IRRIG TUBING LAPAROSCOPIC (IRRIGATION / IRRIGATOR) IMPLANT
SHEARS HARMONIC ACE PLUS 36CM (ENDOMECHANICALS) ×4 IMPLANT
SLEEVE ENDOPATH XCEL 5M (ENDOMECHANICALS) ×8 IMPLANT
SUT ETHIBOND 0 MO6 C/R (SUTURE) ×4 IMPLANT
SUT ETHIBOND CT1 BRD #0 30IN (SUTURE) IMPLANT
SUT MNCRL AB 4-0 PS2 18 (SUTURE) ×4 IMPLANT
SUT PDS AB 2-0 CT1 27 (SUTURE) IMPLANT
TOWEL OR 17X24 6PK STRL BLUE (TOWEL DISPOSABLE) ×4 IMPLANT
TOWEL OR 17X26 10 PK STRL BLUE (TOWEL DISPOSABLE) ×4 IMPLANT
TRAY FOLEY W/BAG SLVR 14FR (SET/KITS/TRAYS/PACK) ×4 IMPLANT
TRAY LAPAROSCOPIC MC (CUSTOM PROCEDURE TRAY) ×4 IMPLANT
TROCAR XCEL BLUNT TIP 100MML (ENDOMECHANICALS) IMPLANT
TROCAR XCEL NON-BLD 11X100MML (ENDOMECHANICALS) ×4 IMPLANT
TROCAR XCEL NON-BLD 5MMX100MML (ENDOMECHANICALS) ×4 IMPLANT
TUBING INSUFFLATION (TUBING) ×4 IMPLANT
WATER STERILE IRR 1000ML POUR (IV SOLUTION) ×4 IMPLANT

## 2017-12-30 NOTE — Discharge Instructions (Signed)
HERNIA REPAIR: POST OP INSTRUCTIONS  ######################################################################  EAT Gradually transition to a high fiber diet with a fiber supplement over the next few weeks after discharge.  Start with a pureed / full liquid diet (see below)  WALK Walk an hour a day.  Control your pain to do that.    CONTROL PAIN Control pain so that you can walk, sleep, tolerate sneezing/coughing, go up/down stairs.  HAVE A BOWEL MOVEMENT DAILY Keep your bowels regular to avoid problems.  OK to try a laxative to override constipation.  OK to use an antidairrheal to slow down diarrhea.  Call if not better after 2 tries  CALL IF YOU HAVE PROBLEMS/CONCERNS Call if you are still struggling despite following these instructions. Call if you have concerns not answered by these instructions  ######################################################################    1. DIET: Follow a light bland diet the first 24 hours after arrival home, such as soup, liquids, crackers, etc.  Be sure to include lots of fluids daily.  Avoid fast food or heavy meals as your are more likely to get nauseated.  Eat a low fat the next few days after surgery. 2. Take your usually prescribed home medications unless otherwise directed. 3. PAIN CONTROL: a. Pain is best controlled by a usual combination of three different methods TOGETHER: i. Ice/Heat ii. Over the counter pain medication iii. Prescription pain medication b. Most patients will experience some swelling and bruising around the hernia(s) such as the bellybutton, groins, or old incisions.  Ice packs or heating pads (30-60 minutes up to 6 times a day) will help. Use ice for the first few days to help decrease swelling and bruising, then switch to heat to help relax tight/sore spots and speed recovery.  Some people prefer to use ice alone, heat alone, alternating between ice & heat.  Experiment to what works for you.  Swelling and bruising can take  several weeks to resolve.   c. It is helpful to take an over-the-counter pain medication regularly for the first few weeks.  Choose one of the following that works best for you: i. Naproxen (Aleve, etc)  Two  tabs twice a day ii. Ibuprofen (Advil, etc) Three  tabs four times a day (every meal & bedtime) iii. Acetaminophen (Tylenol, etc) 325-650mg  four times a day (every meal & bedtime) d. A  prescription for pain medication should be given to you upon discharge.  Take your pain medication as prescribed.  i. If you are having problems/concerns with the prescription medicine (does not control pain, nausea, vomiting, rash, itching, etc), please call us [phone] to see if we need to switch you to a different pain medicine that will work better for you and/or control your side effect better. ii. If you need a refill on your pain medication, please contact your pharmacy.  They will contact our office to request authorization. Prescriptions will not be filled after 5 pm or on week-ends. 4. Avoid getting constipated.  Between the surgery and the pain medications, it is common to experience some constipation.  Increasing fluid intake and taking a fiber supplement (such as Metamucil, Citrucel, FiberCon, MiraLax, etc) 1-2 times a day regularly will usually help prevent this problem from occurring.  A mild laxative (prune juice, Milk of Magnesia, MiraLax, etc) should be taken according to package directions if there are no bowel movements after 48 hours.   5. Wash / shower every day.  You may shower over the skin glue and steri strips as they are waterproof.  6. Remove your waterproof bandages 3 days after surgery.  Steri strips will peel off after 1-2 weeks. Skin glue will flake off after 2-3 weeks. You may leave the incision open to air.  You may replace a dressing/Band-Aid to cover the incision for comfort if you wish.  Continue to shower over incision(s) after the dressing is  off.   7. ACTIVITIES as tolerated:   a. You may resume regular (light) daily activities beginning the next day--such as daily self-care, walking, climbing stairs--gradually increasing activities as tolerated.  If you can walk 30 minutes without difficulty, it is safe to try more intense activity such as jogging, treadmill, bicycling, low-impact aerobics, swimming, etc. b. Refrain from the most intensive and strenuous activity for last such as sit-ups, heavy lifting, contact sports, etc  Refrain from any heavy lifting or straining until 6 weeks after surgery.   c. DO NOT PUSH THROUGH PAIN.  Let pain be your guide: If it hurts to do something, don't do it.  Pain is your body warning you to avoid that activity for another week until the pain goes down. d. You may drive when you are no longer taking prescription pain medication, you can comfortably wear a seatbelt, and you can safely maneuver your car and apply brakes. e. Bonita Quin may have sexual intercourse when it is comfortable.  8. FOLLOW UP in our office a. Please call [facility] at [phone] to set up an appointment to see your surgeon in the office for a follow-up appointment approximately 2-3 weeks after your surgery. b. Make sure that you call for this appointment the day you arrive home to insure a convenient appointment time. 9.  IF YOU HAVE DISABILITY OR FAMILY LEAVE FORMS, BRING THEM TO THE OFFICE FOR PROCESSING.  DO NOT GIVE THEM TO YOUR DOCTOR.  WHEN TO CALL us [phone]: 1. Poor pain control 2. Reactions / problems with new medications (rash/itching, nausea, etc)  3. Fever over 101.5 F (38.5 C) 4. Inability to urinate 5. Nausea and/or vomiting 6. Worsening swelling or bruising 7. Continued bleeding from incision. 8. Increased pain, redness, or drainage from the incision   The clinic staff is available to answer your questions during regular business hours (8:30am-5pm).  Please dont hesitate to call and ask to speak to one of our  nurses for clinical concerns.   If you have a medical emergency, go to the nearest emergency room or call 911.  A surgeon from [facility] is always on call at the hospitals in [facility], [place] [address], [place], [place]  [address] ?  [address] [address], [place], [place]   [address] MAIN: [phone] ? TOLL FREE: [phone] ? FAX: [phone] [email or web address]

## 2017-12-30 NOTE — Op Note (Signed)
Operative Note  Tina Phillips  [id number]  [id number]  12/30/2017   Surgeon: Lady Deutscher ConnorMD  Assistant: Myrtie Soman, RNFA  Procedure performed: laparoscopic repair of incarcerated epigastric hernia with mesh  Preop diagnosis: epigastric hernia Post-op diagnosis/intraop findings: 2 x 1 cm hernia defect just superior to the umbilicus containing incarcerated preperitoneal fat from the falciform ligament  Specimens: none Retained items: none EBL: minimal cc Complications: none  Description of procedure: After obtaining informed consent the patient was taken to the operating room and placed supine on operating room table wheregeneral endotracheal anesthesia was initiated, preoperative antibiotics were administered, SCDs applied, and a formal timeout was performed. He abdomen was prepped and draped in the usual sterile fashion. Peritoneal access was gained with a Visiport technique in the left upper quadrant and insufflation to 15 mmHg ensued without incident. Gross inspection revealed no evidence of injury from entry. There was a small hernia with incarcerated preperitoneal fat at the inferior aspect of the falciform ligament. Under direct visualization a 5 and a 12 mm trocar were placed in the left hemiabdomen after infiltration with local. The incarcerated fat was reduced and the Harmonic scalpel was used to excise this as well as to take the falciform ligament down from the anterior abdominal wall extending cephalad several centimeters to provide space for the mesh. A small stab incision was made over the hernia defect and the PMI device was used to place 3 interrupted 0 Ethibond sutures to close the hernia defect. A 4" x 6" ventralex mesh was then selected and stay sutures of 0 Ethibond placed in the cardinal directions, ith the knots tied on the rough side of the mesh. The rough side was alsomarked and the mesh inserted into the abdominal cavity. It was unfurled and oriented.  Stab incisions were made in the skin and the PMI device was used to bring out the trans-fascial stay sutures which were tied down. The mesh was noted to completely cover the hernia defect as well as the patient's umbilicus with at least 57 m of overlap circumferentially around the hernia. The mesh also overlaps the peritoneal aspect of the 12 mm trocar site. The 12 motor trocar was removed and the fascia closed with a 0 Ethibond using a PMI device. The secure strap tacker was then used to secure the mesh to the anterior abdominal wall with a outer and inner ring of tacks. An additional 5 mm trocar to be placed in the right hemiabdomen to complete the tack placement. There is a little bit of redundancy of the mesh at the inferior aspect which was addressed with tacks as well.The abdomen was once again surveyed and no abnormalities noted, the mesh was well opposed to the abdominal wall and hemostasis was confirmed. The trochars were removed after desufflated and the abdomen. The skin incisions were closed with subcuticular Monocryl and Dermabond. The patient was then awakened, extubated and taken to PACU in stable condition.   All counts were correct at the completion of the case.

## 2017-12-30 NOTE — Progress Notes (Signed)
Dr Fredricka Bonine here to see pt and is aware of her c/o of pain despite medication. Pt to DC to home.

## 2017-12-30 NOTE — Transfer of Care (Signed)
Immediate Anesthesia Transfer of Care Note  Patient: Tina Phillips  Procedure(s) Performed: LAPAROSCOPIC VENTRAL HERNIA WITH MESH (N/A Abdomen) INSERTION OF MESH (N/A )  Patient Location: PACU  Anesthesia Type:General  Level of Consciousness: drowsy  Airway & Oxygen Therapy: Patient Spontanous Breathing and Patient connected to nasal cannula oxygen  Post-op Assessment: Report given to RN and Post -op Vital signs reviewed and stable  Post vital signs: Reviewed and stable  Last Vitals:  Vitals Value Taken Time  BP 112/78 12/30/2017  2:31 PM  Temp    Pulse 77 12/30/2017  2:33 PM  Resp 13 12/30/2017  2:33 PM  SpO2 100 % 12/30/2017  2:33 PM  Vitals shown include unvalidated device data.  Last Pain:  Vitals:   12/30/17 1145  TempSrc:   PainSc: 0-No pain      Patients Stated Pain Goal: 3 (12/30/17 1145)  Complications: No apparent anesthesia complications

## 2017-12-30 NOTE — Interval H&P Note (Signed)
History and Physical Interval Note:  12/30/2017 12:08 PM  Tina Phillips  has presented today for surgery, with the diagnosis of VENTRAL HERNIA  The various methods of treatment have been discussed with the patient and family. After consideration of risks, benefits and other options for treatment, the patient has consented to  Procedure(s): LAPAROSCOPIC VENTRAL HERNIA WITH MESH (N/A) INSERTION OF MESH (N/A) as a surgical intervention .  The patient's history has been reviewed, patient examined, no change in status, stable for surgery.  I have reviewed the patient's chart and labs.  Questions were answered to the patient's satisfaction.     Chelsea Lollie Sails

## 2017-12-30 NOTE — Anesthesia Preprocedure Evaluation (Signed)
Anesthesia Evaluation  Patient identified by MRN, date of birth, ID band Patient awake    Reviewed: Allergy & Precautions, NPO status , Patient's Chart, lab work & pertinent test results  Airway Mallampati: II  TM Distance: >3 FB Neck ROM: Full    Dental no notable dental hx. (+) Teeth Intact, Dental Advisory Given   Pulmonary neg pulmonary ROS,    Pulmonary exam normal breath sounds clear to auscultation       Cardiovascular negative cardio ROS Normal cardiovascular exam Rhythm:Regular Rate:Normal     Neuro/Psych negative neurological ROS  negative psych ROS   GI/Hepatic negative GI ROS, Neg liver ROS,   Endo/Other  negative endocrine ROS  Renal/GU negative Renal ROS  negative genitourinary   Musculoskeletal negative musculoskeletal ROS (+)   Abdominal   Peds negative pediatric ROS (+)  Hematology negative hematology ROS (+) anemia ,   Anesthesia Other Findings   Reproductive/Obstetrics negative OB ROS                            pregnant  Lab Results  Component Value Date   WBC 7.3 12/21/2017   HGB 11.2 (L) 12/21/2017   HCT 36.0 12/21/2017   MCV 87.4 12/21/2017   PLT 268 12/21/2017    Anesthesia Physical Anesthesia Plan  ASA: II  Anesthesia Plan: General   Post-op Pain Management:    Induction: Intravenous  PONV Risk Score and Plan: 3 and Treatment may vary due to age or medical condition, Ondansetron and Dexamethasone  Airway Management Planned: Oral ETT  Additional Equipment:   Intra-op Plan:   Post-operative Plan: Extubation in OR  Informed Consent: I have reviewed the patients History and Physical, chart, labs and discussed the procedure including the risks, benefits and alternatives for the proposed anesthesia with the patient or authorized representative who has indicated his/her understanding and acceptance.   Dental advisory given  Plan Discussed with:  CRNA  Anesthesia Plan Comments:         Anesthesia Quick Evaluation

## 2017-12-30 NOTE — Anesthesia Procedure Notes (Signed)
Procedure Name: Intubation Date/Time: 12/30/2017 12:42 PM Performed by: Leonor Liv, CRNA Pre-anesthesia Checklist: Patient identified, Emergency Drugs available, Suction available and Patient being monitored Patient Re-evaluated:Patient Re-evaluated prior to induction Oxygen Delivery Method: Circle System Utilized Preoxygenation: Pre-oxygenation with 100% oxygen Induction Type: IV induction Ventilation: Mask ventilation without difficulty Laryngoscope Size: Mac and 3 Grade View: Grade I Tube type: Oral Tube size: 7.0 mm Number of attempts: 1 Airway Equipment and Method: Stylet Placement Confirmation: ETT inserted through vocal cords under direct vision,  positive ETCO2 and breath sounds checked- equal and bilateral Secured at: 21 cm Tube secured with: Tape Dental Injury: Teeth and Oropharynx as per pre-operative assessment

## 2017-12-30 NOTE — H&P (Signed)
Tina Phillips Patient #: [id number] DOB: May 29, 1991 Undefined / Language: [place] / Race: Black or African American Female  History of Present Illness  Patient words: 27 year old otherwise healthy woman with a supraumbilical hernia. Had been seen by Dr. Claudine Mouton in [facility] and scheduled for surgery a week from today for robotic hernia repair but has decided not to pursue surgery there. here with her mohter, [name] Her workup has included a CT scan which demonstrates a 2.6 x 0.9 cm supraumbilical midline hernia containing fat, as well as an ultrasound that is negative for gallstones or gallbladder wall thickening according to review of Dr. Benson Norway notes in care everywhere (image is not available for my review). She describes pain just above the umbilicus which is pulling and quality, exacerbated by physical activity or stretching. She does endorse a history of belching as well as upper abdominal pain after eating spicy foods.  No prior abdominal surgeries. No medical problems. No allergies. Family history noncontributory.  She works in Engineering geologist, nonsmoker.    Allergies  No Known Allergies [11/23/2017]: Allergies Reconciled  Medication History Multiple Vitamin (Oral) Active.   Review of Systems  All other systems negative  Vitals  Weight: 179.5 lb Height: 67in Body Surface Area: 1.93 m Body Mass Index: 28.11 kg/m  Temp.: 98.27F(Oral)  Pulse: 84 (Regular)  BP: 122/90 (Sitting, Right Arm, Standard)      Physical Exam   The physical exam findings are as follows: Note:Gen: alert and well appearing Eye: extraocular motion intact, no scleral icterus ENT: moist mucus membranes, dentition intact Neck: no mass or thyromegaly Chest: unlabored respirations, symmetrical air entry, clear bilaterally CV: regular rate and rhythm, no pedal edema Abdomen: soft, nontender, nondistended. No mass or organomegaly. There is a vaguely palpable fascial  defect just superior to the umbilicus with no herniated structures. The area is focally tender. MSK: strength symmetrical throughout, no deformity Neuro: grossly intact, normal gait Psych: normal mood and affect, appropriate insight Skin: warm and dry, no rash or lesion on limited exam    Assessment & Plan   VENTRAL HERNIA (K43.9) Story: Discussed open versus laparoscopic repair. I recommended laparoscopic and we discussed the risks of surgery including bleeding, infection, pain, scarring, injury to intra-abdominal structures, hernia recurrence. Discussed use of mesh. Also discussed that the epigastric pain and belching are unlikely to be resolved from hernia repair. She expressed understanding. Questions were welcomed and answered. She would like to proceed with laparoscopic hernia repair with mesh.

## 2017-12-30 NOTE — Progress Notes (Signed)
Pt crying when awake, "why does it hurt", doesn't want to get OOB. Dr Fredricka Bonine (in OR) updated). Will be over to see pt.

## 2018-01-01 NOTE — Anesthesia Postprocedure Evaluation (Signed)
Anesthesia Post Note  Patient: Tina Phillips  Procedure(s) Performed: LAPAROSCOPIC VENTRAL HERNIA WITH MESH (N/A Abdomen) INSERTION OF MESH (N/A )     Patient location during evaluation: PACU Anesthesia Type: General Level of consciousness: awake and alert Pain management: pain level controlled Vital Signs Assessment: post-procedure vital signs reviewed and stable Respiratory status: spontaneous breathing, nonlabored ventilation, respiratory function stable and patient connected to nasal cannula oxygen Cardiovascular status: blood pressure returned to baseline and stable Postop Assessment: no apparent nausea or vomiting Anesthetic complications: no    Last Vitals:  Vitals:   12/30/17 1600 12/30/17 1615  BP: 115/64 111/67  Pulse: 75 84  Resp: 16 15  Temp:  (!) 36.3 C  SpO2: 98% 98%    Last Pain:  Vitals:   12/30/17 1615  TempSrc:   PainSc: 6                  Trevor Iha

## 2018-01-02 ENCOUNTER — Encounter (HOSPITAL_COMMUNITY): Payer: Self-pay | Admitting: Surgery

## 2018-02-17 ENCOUNTER — Emergency Department (HOSPITAL_COMMUNITY): Payer: Medicaid Other

## 2018-02-17 ENCOUNTER — Encounter (HOSPITAL_COMMUNITY): Payer: Self-pay

## 2018-02-17 ENCOUNTER — Emergency Department (HOSPITAL_COMMUNITY)
Admission: EM | Admit: 2018-02-17 | Discharge: 2018-02-17 | Disposition: A | Discharge status: Home / Self Care | Payer: Medicaid Other | Attending: Emergency Medicine | Admitting: Emergency Medicine

## 2018-02-17 ENCOUNTER — Other Ambulatory Visit: Payer: Self-pay

## 2018-02-17 DIAGNOSIS — N3090 Cystitis, unspecified without hematuria: Secondary | ICD-10-CM | POA: Insufficient documentation

## 2018-02-17 DIAGNOSIS — R1010 Upper abdominal pain, unspecified: Secondary | ICD-10-CM | POA: Insufficient documentation

## 2018-02-17 DIAGNOSIS — R109 Unspecified abdominal pain: Secondary | ICD-10-CM | POA: Insufficient documentation

## 2018-02-17 DIAGNOSIS — B9689 Other specified bacterial agents as the cause of diseases classified elsewhere: Secondary | ICD-10-CM | POA: Insufficient documentation

## 2018-02-17 DIAGNOSIS — N760 Acute vaginitis: Secondary | ICD-10-CM | POA: Insufficient documentation

## 2018-02-17 DIAGNOSIS — N938 Other specified abnormal uterine and vaginal bleeding: Secondary | ICD-10-CM | POA: Insufficient documentation

## 2018-02-17 DIAGNOSIS — Z79899 Other long term (current) drug therapy: Secondary | ICD-10-CM | POA: Insufficient documentation

## 2018-02-17 DIAGNOSIS — R1030 Lower abdominal pain, unspecified: Secondary | ICD-10-CM | POA: Insufficient documentation

## 2018-02-17 LAB — COMPREHENSIVE METABOLIC PANEL
ALBUMIN: 4.1 g/dL (ref 3.5–5.0)
ALT: 12 U/L (ref 0–44)
AST: 17 U/L (ref 15–41)
Alkaline Phosphatase: 49 U/L (ref 38–126)
Anion gap: 7 (ref 5–15)
BUN: 10 mg/dL (ref 6–20)
CHLORIDE: 107 mmol/L (ref 98–111)
CO2: 26 mmol/L (ref 22–32)
Calcium: 8.8 mg/dL — ABNORMAL LOW (ref 8.9–10.3)
Creatinine, Ser: 0.84 mg/dL (ref 0.44–1.00)
GFR calc Af Amer: >60 mL/min (ref >60)
GFR calc non Af Amer: >60 mL/min (ref >60)
GLUCOSE: 85 mg/dL (ref 70–99)
POTASSIUM: 3.8 mmol/L (ref 3.5–5.1)
SODIUM: 140 mmol/L (ref 135–145)
Total Bilirubin: 0.4 mg/dL (ref 0.3–1.2)
Total Protein: 7.5 g/dL (ref 6.5–8.1)

## 2018-02-17 LAB — URINALYSIS, ROUTINE W REFLEX MICROSCOPIC
Bilirubin Urine: NEGATIVE
Glucose, UA: NEGATIVE mg/dL
Ketones, ur: NEGATIVE mg/dL
Nitrite: POSITIVE — AB
PROTEIN: NEGATIVE mg/dL
SPECIFIC GRAVITY, URINE: 1.024 (ref 1.005–1.030)
pH: 6 (ref 5.0–8.0)

## 2018-02-17 LAB — I-STAT BETA HCG BLOOD, ED (MC, WL, AP ONLY)

## 2018-02-17 LAB — LIPASE, BLOOD: LIPASE: 27 U/L (ref 11–51)

## 2018-02-17 LAB — WET PREP, GENITAL
Sperm: NONE SEEN
TRICH WET PREP: NONE SEEN
Yeast Wet Prep HPF POC: NONE SEEN

## 2018-02-17 LAB — CBC
HEMATOCRIT: 38.2 % (ref 36.0–46.0)
Hemoglobin: 11.8 g/dL — ABNORMAL LOW (ref 12.0–15.0)
MCH: 26 pg (ref 26.0–34.0)
MCHC: 30.9 g/dL (ref 30.0–36.0)
MCV: 84.1 fL (ref 78.0–100.0)
Platelets: 329 10*3/uL (ref 150–400)
RBC: 4.54 MIL/uL (ref 3.87–5.11)
RDW: 14 % (ref 11.5–15.5)
WBC: 7.7 10*3/uL (ref 4.0–10.5)

## 2018-02-17 MED ORDER — METRONIDAZOLE 500 MG PO TABS: 500.0000 mg | ORAL_TABLET | Freq: Two times a day (BID) | ORAL | 0 refills | Status: DC

## 2018-02-17 MED ORDER — CEPHALEXIN 500 MG PO CAPS: 500.0000 mg | ORAL_CAPSULE | Freq: Two times a day (BID) | ORAL | 0 refills | Status: DC

## 2018-02-17 MED ORDER — IOPAMIDOL (ISOVUE-300) INJECTION 61%
INTRAVENOUS | Status: AC
  Filled 2018-02-17: qty 100

## 2018-02-17 MED ORDER — IOPAMIDOL (ISOVUE-300) INJECTION 61%
100.0000 mL | Freq: Once | INTRAVENOUS | Status: AC | PRN
  Administered 2018-02-17: 100 mL via INTRAVENOUS

## 2018-02-17 NOTE — Discharge Instructions (Addendum)
You were seen in the emergency department today for abdominal pain.  Your CT scan showed that you do have a small hernia around your navel area that looks somewhat inflamed.  You also had findings consistent with bacterial vaginosis as well as a UTI.  We are treating these with antibiotics.  Keflex is for the UTI, Flagyl as for the bacterial vaginosis.  You may not drink alcohol when taking Flagyl as it can have potentially very serious side effects. Please take all of your antibiotics until finished. You may develop abdominal discomfort or diarrhea from the antibiotic.  You may help offset this with probiotics which you can buy at the store (ask your pharmacist if unable to find) or get probiotics in the form of eating yogurt. Do not eat or take the probiotics until 2 hours after your antibiotic. If you are unable to tolerate these side effects follow-up with your primary care provider or return to the emergency department.   If you begin to experience any blistering, rashes, swelling, or difficulty breathing seek medical care for evaluation of potentially more serious side effects.   Please be aware that this medication may interact with other medications you are taking, please be sure to discuss your medication list with your pharmacist.   Please follow-up with your primary care provider within 1 week.  You may also follow-up with Dr. Doylene Canardonner your general surgeon.  Return to the ER anytime for new or worsening symptoms or any other concerns.

## 2018-02-17 NOTE — ED Notes (Signed)
Patient is unable to urinate at this time 

## 2018-02-17 NOTE — ED Triage Notes (Signed)
Patient c/o intermittent right lower abdominal pain x 1 week and the worst last night.. Patient had a hernia repair on 12/30/17. patient has addressed the issue of pain with the surgeon. Patient denies N/v/d.

## 2018-02-17 NOTE — ED Provider Notes (Signed)
[facility]-EMERGENCY DEPT Provider Note   CSN: [id number] Arrival date & time: 02/17/18  1512     History   Chief Complaint Chief Complaint  Patient presents with  . Abdominal Pain    HPI Tina Phillips is a 27 y.o. female with a hx of anemia and ventral epigastric hernia repair with insertion of mesh (12/30/17) who presents to the ED for intermittent R sided abdominal pain x 1 month. Patient states pain is the R upper/lower abdomen, it does not necessarily occur daily. No specific triggers. When it occurs it lasts for approximately 1 hour, she typically takes a hydrocodone at it resolves. No other specific alleviating/aggravating factors. She has seen her surgeon Dr. Fredricka Bonine- felt this may be adhesions related, unclear definitve etiology. Denies fever, chills, nausea, vomiting, or dysuria. She reports she does get an "odd feeling when she has to urinate". She states her LMP was beginning of the month, she may have started menstruating today there was blood when she wiped. Denies vaginal discharge. She is sexually active in a monogamous relationship, she has no concern for STD.   HPI  Past Medical History:  Diagnosis Date  . Anemia   . Ventral hernia     Patient Active Problem List   Diagnosis Date Noted  . Migraine without aura 07/18/2015    Past Surgical History:  Procedure Laterality Date  . INSERTION OF MESH N/A 12/30/2017   Procedure: INSERTION OF MESH;  Surgeon: Berna Bue, MD;  Location: [facility] OR;  Service: General;  Laterality: N/A;  . VENTRAL HERNIA REPAIR N/A 12/30/2017   Procedure: LAPAROSCOPIC VENTRAL HERNIA WITH MESH;  Surgeon: Berna Bue, MD;  Location: [facility] OR;  Service: General;  Laterality: N/A;  . WISDOM TOOTH EXTRACTION       OB History   None      Home Medications    Prior to Admission medications   Medication Sig Start Date End Date Taking? Authorizing Provider  aspirin-acetaminophen-caffeine (EXCEDRIN MIGRAINE)  [phone] MG tablet Take 1-2 tablets by mouth every 6 (six) hours as needed for headache.     [provider]  Aspirin-Acetaminophen-Caffeine (GOODYS EXTRA STRENGTH) [phone] MG PACK Take 2-3 packets by mouth daily as needed (for headache/migraine.). GOODY'S POWDERS    [provider]  Biotin 69629 MCG TABS Take 10,000 mcg by mouth daily.    [provider]  Multiple Vitamin (MULTIVITAMIN WITH MINERALS) TABS tablet Take 1 tablet by mouth daily.    [provider]  oxyCODONE-acetaminophen (PERCOCET/ROXICET) 5-325 MG tablet Take 1 tablet by mouth every 6 (six) hours as needed for severe pain. 12/30/17   Berna Bue, MD    Family History Family History  Problem Relation Age of Onset  . Diabetes Mother   . Diabetes Father   . Hyperlipidemia Father   . Post-traumatic stress disorder Father   . Prostate cancer Father     Social History Social History   Tobacco Use  . Smoking status: Never Smoker  . Smokeless tobacco: Never Used  Substance Use Topics  . Alcohol use: No  . Drug use: No     Allergies   Patient has no known allergies.   Review of Systems Review of Systems  Constitutional: Negative for chills and fever.  Respiratory: Negative for shortness of breath.   Cardiovascular: Negative for chest pain.  Gastrointestinal: Positive for abdominal pain. Negative for blood in stool, constipation, diarrhea, nausea and vomiting.  Genitourinary: Positive for vaginal bleeding (Possible). Negative  for dysuria, frequency and vaginal discharge.  All other systems reviewed and are negative.    Physical Exam Updated Vital Signs BP 114/79 (BP Location: Right Arm)   Pulse 76   Temp 98.5 F (36.9 C) (Oral)   Resp 18   Ht 5\' 7"  (1.702 m)   Wt 80.7 kg (178 lb)   LMP 01/25/2018 (Approximate)   SpO2 100%   BMI 27.88 kg/m   Physical Exam  Constitutional: She appears well-developed and well-nourished. No distress.  HENT:  Head:  Normocephalic and atraumatic.  Eyes: Conjunctivae are normal. Right eye exhibits no discharge. Left eye exhibits no discharge.  Cardiovascular: Normal rate and regular rhythm.  No murmur heard. Pulmonary/Chest: Breath sounds normal. No respiratory distress. She has no wheezes. She has no rales.  Abdominal: Soft. She exhibits no distension. There is tenderness in the right upper quadrant and right lower quadrant. There is no rigidity, no rebound, no guarding and no CVA tenderness.  Genitourinary: Pelvic exam was performed with patient supine. There is no rash or tenderness on the right labia. There is no rash or tenderness on the left labia. Cervix exhibits no motion tenderness. Right adnexum displays no mass, no tenderness and no fullness. Left adnexum displays no mass, no tenderness and no fullness. There is bleeding (Minimal amount from the cervical os) in the vagina. Vaginal discharge (Mild white) found.  Genitourinary Comments: EDT present as chaperone.   Neurological: She is alert.  Clear speech.   Skin: Skin is warm and dry. No rash noted.  Psychiatric: She has a normal mood and affect. Her behavior is normal.  Nursing note and vitals reviewed.    ED Treatments / Results  Labs Results for orders placed or performed during the hospital encounter of 02/17/18  Wet prep, genital  Result Value Ref Range   Yeast Wet Prep HPF POC NONE SEEN NONE SEEN   Trich, Wet Prep NONE SEEN NONE SEEN   Clue Cells Wet Prep HPF POC PRESENT (A) NONE SEEN   WBC, Wet Prep HPF POC RARE (A) NONE SEEN   Sperm NONE SEEN   Lipase, blood  Result Value Ref Range   Lipase 27 11 - 51 U/L  Comprehensive metabolic panel  Result Value Ref Range   Sodium 140 135 - 145 mmol/L   Potassium 3.8 3.5 - 5.1 mmol/L   Chloride 107 98 - 111 mmol/L   CO2 26 22 - 32 mmol/L   Glucose, Bld 85 70 - 99 mg/dL   BUN 10 6 - 20 mg/dL   Creatinine, Ser 1.61 0.44 - 1.00 mg/dL   Calcium 8.8 (L) 8.9 - 10.3 mg/dL   Total Protein 7.5  6.5 - 8.1 g/dL   Albumin 4.1 3.5 - 5.0 g/dL   AST 17 15 - 41 U/L   ALT 12 0 - 44 U/L   Alkaline Phosphatase 49 38 - 126 U/L   Total Bilirubin 0.4 0.3 - 1.2 mg/dL   GFR calc non Af Amer >60 >60 mL/min   GFR calc Af Amer >60 >60 mL/min   Anion gap 7 5 - 15  CBC  Result Value Ref Range   WBC 7.7 4.0 - 10.5 K/uL   RBC 4.54 3.87 - 5.11 MIL/uL   Hemoglobin 11.8 (L) 12.0 - 15.0 g/dL   HCT 09.6 04.5 - 40.9 %   MCV 84.1 78.0 - 100.0 fL   MCH 26.0 26.0 - 34.0 pg   MCHC 30.9 30.0 - 36.0 g/dL   RDW 14.0  11.5 - 15.5 %   Platelets 329 150 - 400 K/uL  Urinalysis, Routine w reflex microscopic  Result Value Ref Range   Color, Urine YELLOW YELLOW   APPearance CLOUDY (A) CLEAR   Specific Gravity, Urine 1.024 1.005 - 1.030   pH 6.0 5.0 - 8.0   Glucose, UA NEGATIVE NEGATIVE mg/dL   Hgb urine dipstick SMALL (A) NEGATIVE   Bilirubin Urine NEGATIVE NEGATIVE   Ketones, ur NEGATIVE NEGATIVE mg/dL   Protein, ur NEGATIVE NEGATIVE mg/dL   Nitrite POSITIVE (A) NEGATIVE   Leukocytes, UA MODERATE (A) NEGATIVE   RBC / HPF 0-5 0 - 5 RBC/hpf   WBC, UA 21-50 0 - 5 WBC/hpf   Bacteria, UA MANY (A) NONE SEEN   Squamous Epithelial / LPF 21-50 0 - 5   Mucus PRESENT   I-Stat beta hCG blood, ED  Result Value Ref Range   I-stat hCG, quantitative <5.0 <5 mIU/mL   Comment 3           EKG None  Radiology Ct Abdomen Pelvis W Contrast  Result Date: 02/17/2018 CLINICAL DATA:  27 year old female with abdominal pain. History of hernia repair on 12/30/2017. EXAM: CT ABDOMEN AND PELVIS WITH CONTRAST TECHNIQUE: Multidetector CT imaging of the abdomen and pelvis was performed using the standard protocol following bolus administration of intravenous contrast. CONTRAST:  100mL ISOVUE-300 IOPAMIDOL (ISOVUE-300) INJECTION 61% COMPARISON:  None. FINDINGS: Lower chest: The visualized lung bases are clear. No intra-abdominal free air.  Trace free fluid within the pelvis. Hepatobiliary: Subcentimeter hypodense lesion in the right  lobe of the liver is too small to characterize but may represent a cyst or hemangioma. The liver is otherwise unremarkable. No intrahepatic biliary ductal dilatation. The gallbladder is unremarkable. Pancreas: Unremarkable. No pancreatic ductal dilatation or surrounding inflammatory changes. Spleen: Normal in size without focal abnormality. Adrenals/Urinary Tract: Adrenal glands are unremarkable. Kidneys are normal, without renal calculi, focal lesion, or hydronephrosis. Bladder is unremarkable. Stomach/Bowel: There is no bowel obstruction or active inflammation. Normal caliber fecalized loops of distal small bowel may represent small intestine bacterial overgrowth) increased transit time. The appendix is normal. Vascular/Lymphatic: No significant vascular findings are present. No enlarged abdominal or pelvic lymph nodes. Reproductive: The uterus is anteverted. Prominence of the posterior body myometrium may represent a fibroid or adenomyosis. There is a 1.5 cm corpus luteum in the left ovary. The right ovary is unremarkable. Other: Ventral hernia repair mesh noted. There is mild stranding of the omental fat deep to the mesh (series 2, image 47) which may be reactive. There is abutment of loops of bowel to the mesh at the level of the umbilicus, likely degree of adhesion. There is an ovoid 1.5 x 3.0 cm fatty lesion with inflammatory changes in the umbilicus which may represent an inflamed subcutaneous fat or inflamed small fat containing hernia secondary to incarceration. Correlation with clinical exam and point tenderness over the umbilicus recommended. No fluid collection. Musculoskeletal: No acute or significant osseous findings. IMPRESSION: 1. Postsurgical changes of ventral hernia repair. Minimal stranding of the mesentery and omentum along the posterior aspect of the mesh, likely reactive. No fluid collection or abscess. 2. High attenuating fatty lesion within the umbilicus may represent an inflamed umbilical  fat or inflamed fat containing hernia secondary to incarceration. Correlation with clinical exam and point tenderness over the umbilicus recommended. No fluid collection. 3. No bowel obstruction or active inflammation.  Normal appendix. 4. A 1.5 cm left ovarian corpus luteum. Electronically Signed   By: Burtis JunesArash  Radparvar M.D.   On: 02/17/2018 21:32    Procedures Procedures (including critical care time)  Medications Ordered in ED Medications - No data to display   Initial Impression / Assessment and Plan / ED Course  I have reviewed the triage vital signs and the nursing notes.  Pertinent labs & imaging results that were available during my care of the patient were reviewed by me and considered in my medical decision making (see chart for details).   Patient presents to the emergency department with complaints of abdominal pain.  Patient nontoxic-appearing, no apparent distress, vitals WNL.  Lab work-up per triage reviewed: No leukocytosis, anemia is at baseline, mild hypocalcemia at 8.8, no significant electrolyte abnormalities.  Renal function, LFTs, and lipase WNL.  Pregnancy negative, no indication of ectopic pregnancy. Abdominal exam notable for right upper and right lower quadrant tenderness to palpation, no peritoneal signs.  Pelvic exam performed-no adnexal tenderness or cervical motion tenderness, doubt PID, tubo-ovarian abscess, or ovarian torsion.  Will further evaluate with CT abdomen pelvis  CT notable for postsurgical change status post ventral hernia repair, no fluid collection or abscess.  There is mention of high attenuating fatty lesion within the umbilicus which may represent an inflamed umbilical fat or inflamed fat containing hernia secondary to incarceration. Correlation with clinical exam and point tenderness over the umbilicus recommended. No fluid collection--- on repeat abdominal exam patient does have a very small umbilical hernia which is reducible, minimal tenderness in  this area, no overlying skin changes, does not appear to be incarcerated.  Patient remains without peritoneal signs. CT additionally notable for no bowel obstruction or active inflammation.  Normal appendix. 1.5 cm left ovarian corpus luteum. CT scan without obvious underlying cause of patient's pain given pain is more in the R side of the abdomen.  UA concerning for infection, patient with some vague urinary symptoms will treat for UTI with keflex. Will cover for BV with Flagyl, instructed patient no EtOH with this medicine. Patient without concern for STDs, doubtful of PID. Will have patient follow up with either her PCP or her general surgeon. I discussed results, treatment plan, need for follow-up, and return precautions with the patient and her mother. Provided opportunity for questions, patient and her mother confirmed understanding and are in agreement with plan.   Findings and plan of care discussed with supervising physician Dr. Rubin Payor who personally evaluated and examined this patient, in agreement with plan.    Final Clinical Impressions(s) / ED Diagnoses   Final diagnoses:  Abdominal pain, unspecified abdominal location  Bacterial vaginosis  Cystitis    ED Discharge Orders        Ordered    cephALEXin (KEFLEX) 500 MG capsule  2 times daily     02/17/18 2241    metroNIDAZOLE (FLAGYL) 500 MG tablet  2 times daily     02/17/18 2241       Regis Wiland, Pleas Koch, PA-C 02/17/18 2312    Benjiman Core, MD 02/17/18 [phone]

## 2018-02-18 LAB — HIV ANTIBODY (ROUTINE TESTING W REFLEX): HIV SCREEN 4TH GENERATION: NONREACTIVE

## 2018-02-20 LAB — GC/CHLAMYDIA PROBE AMP (~~LOC~~) NOT AT ARMC
Chlamydia: NEGATIVE
Neisseria Gonorrhea: NEGATIVE

## 2018-07-31 IMAGING — CT CT ABD-PELV W/ CM
2 of 4 series · 15 of 46 positions shown, 17 images · IV contrast (ISOVUE)
Comparison: None.

CLINICAL DATA: 26-year-old female with abdominal pain. History of
hernia repair on 12/30/2017.

EXAM:
CT ABDOMEN AND PELVIS WITH CONTRAST
TECHNIQUE: Multidetector CT imaging of the abdomen and pelvis was performed
using the standard protocol following bolus administration of
intravenous contrast.
CONTRAST:  100mL N9TCQ5-VAA IOPAMIDOL (N9TCQ5-VAA) INJECTION 61%

[Series 2: axial st · axial · 0.80mm/px · z∈[-415,+0]mm · 12 of 93 slices shown, 14 images]
[im 5/93  soft-tissue]
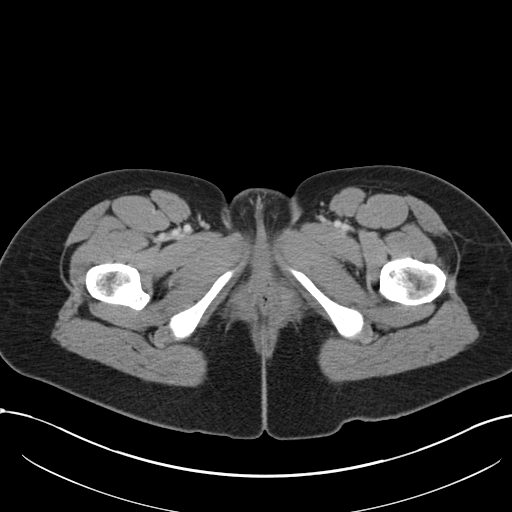
[im 5/93  bone]
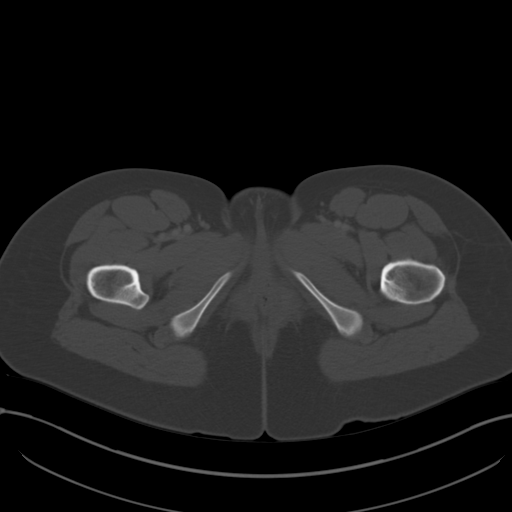
[im 14/93  soft-tissue]
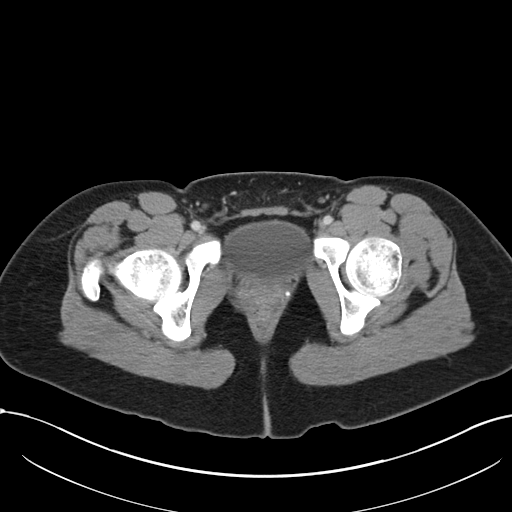
[im 22/93  soft-tissue]
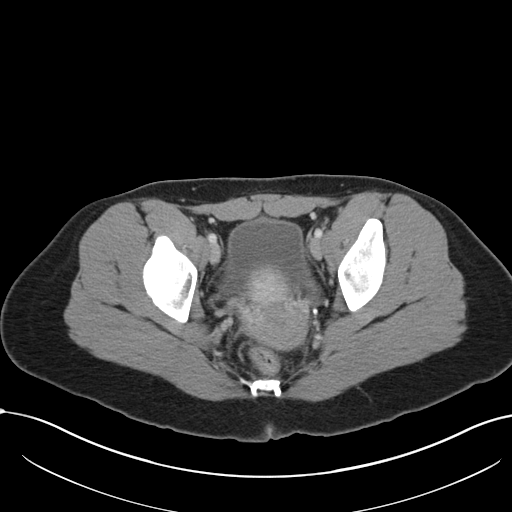
[im 27/93  soft-tissue]
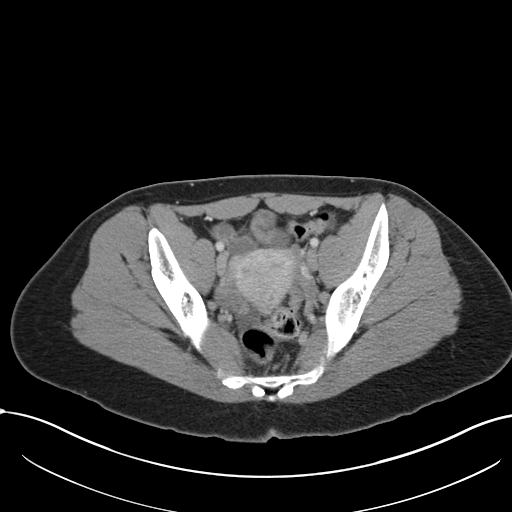
[im 36/93  soft-tissue]
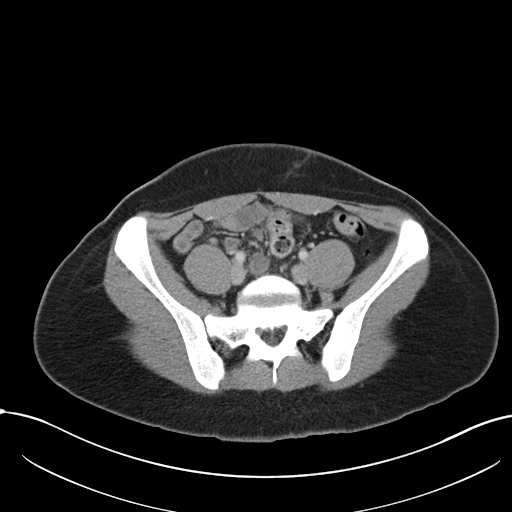
[im 44/93  soft-tissue]
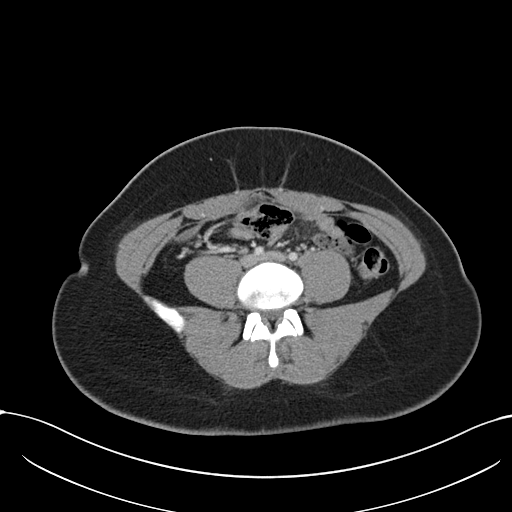
[im 49/93  soft-tissue]
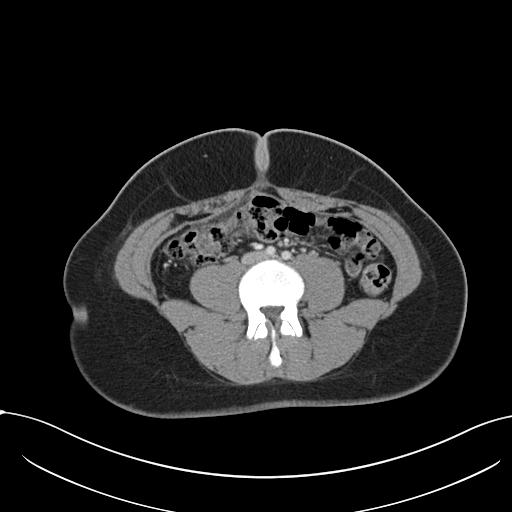
[im 57/93  soft-tissue]
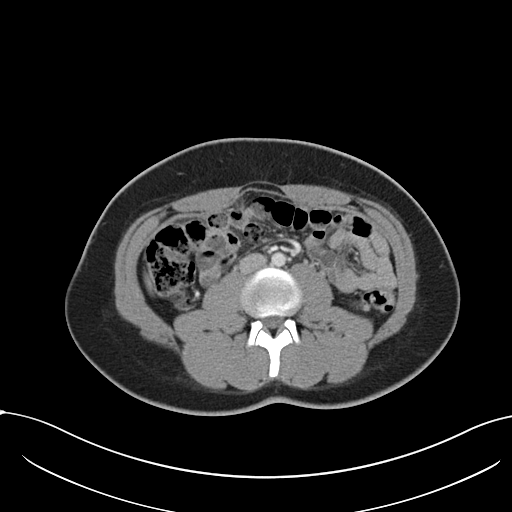
[im 66/93  soft-tissue]
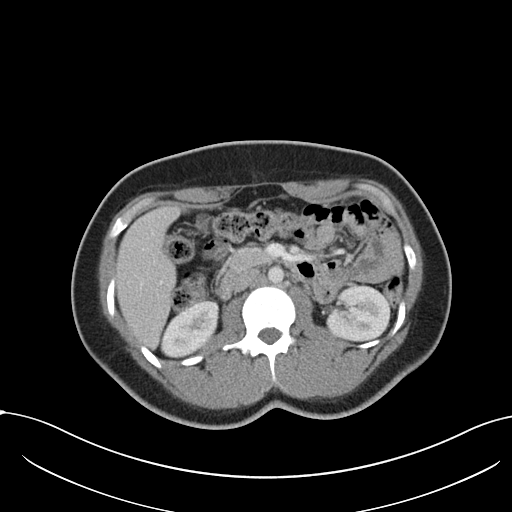
[im 66/93  bone]
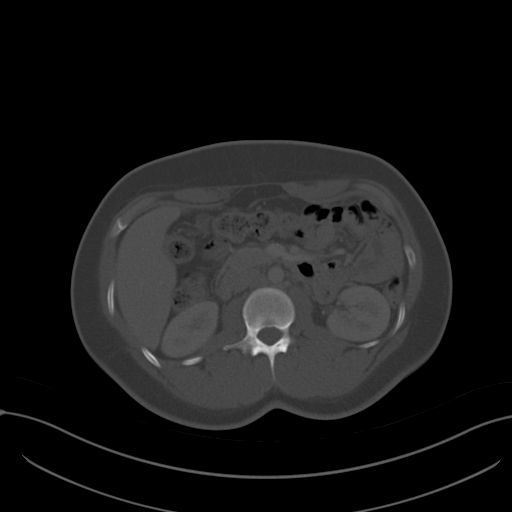
[im 71/93  soft-tissue]
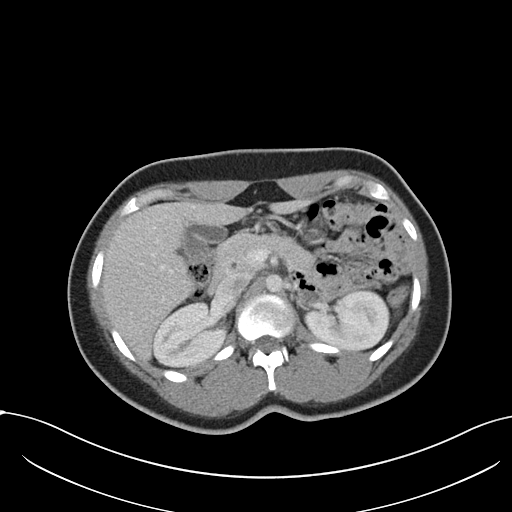
[im 79/93  soft-tissue]
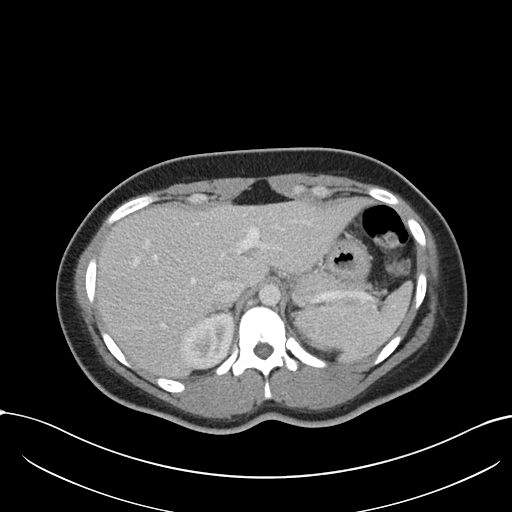
[im 88/93  soft-tissue]
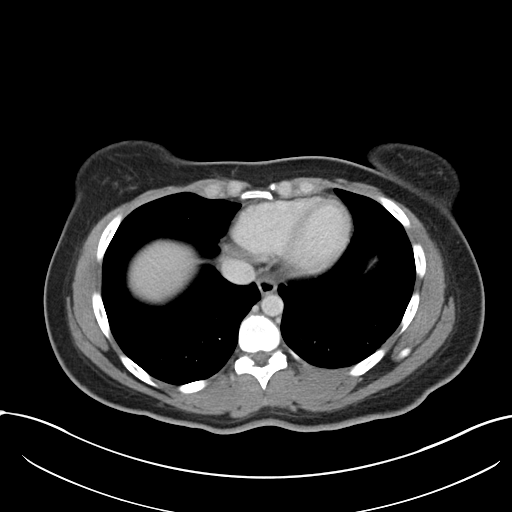

[Series 5: coronal st · coronal · 0.71mm/px · 3 of 86 slices shown]
[im 29/86  soft-tissue]
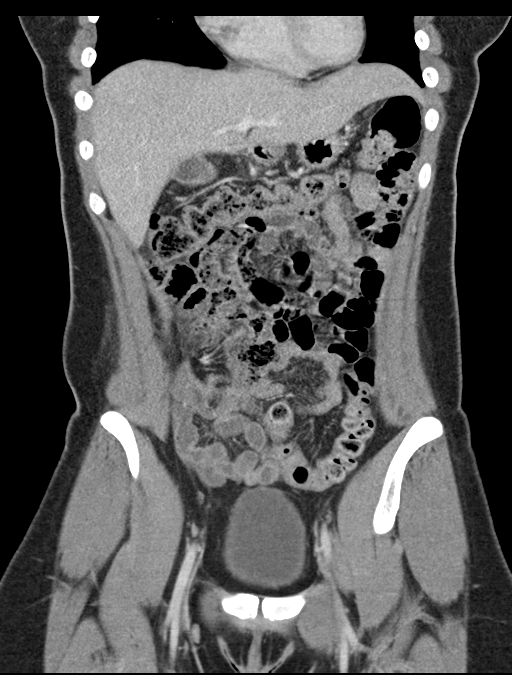
[im 38/86  soft-tissue]
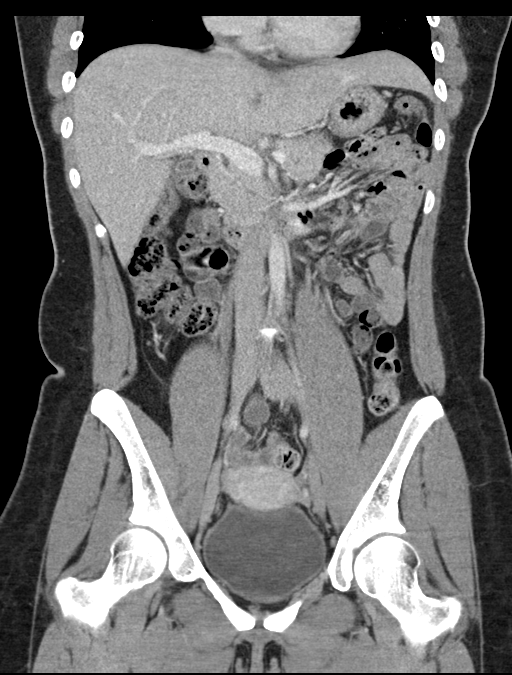
[im 48/86  soft-tissue]
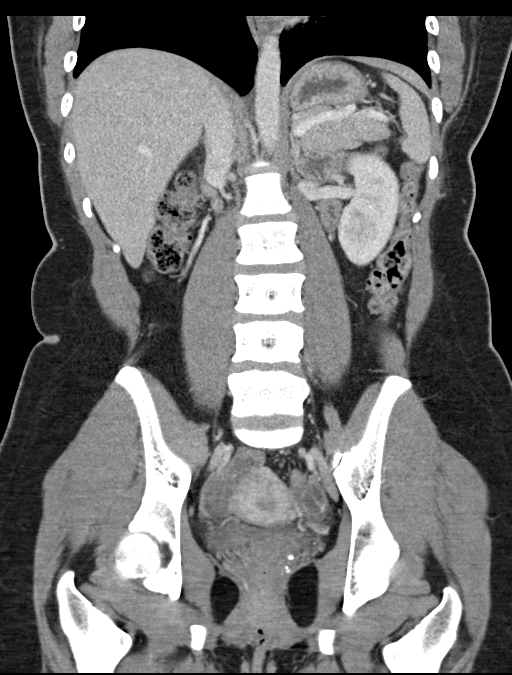

[15 of 46 positions shown; findings below may reference images not displayed]

FINDINGS: Lower chest: The visualized lung bases are clear.

No intra-abdominal free air.  Trace free fluid within the pelvis.

Hepatobiliary: Subcentimeter hypodense lesion in the right lobe of
the liver is too small to characterize but may represent a cyst or
hemangioma. The liver is otherwise unremarkable. No intrahepatic
biliary ductal dilatation. The gallbladder is unremarkable.

Pancreas: Unremarkable. No pancreatic ductal dilatation or
surrounding inflammatory changes.

Spleen: Normal in size without focal abnormality.

Adrenals/Urinary Tract: Adrenal glands are unremarkable. Kidneys are
normal, without renal calculi, focal lesion, or hydronephrosis.
Bladder is unremarkable.

Stomach/Bowel: There is no bowel obstruction or active inflammation.
Normal caliber fecalized loops of distal small bowel may represent
small intestine bacterial overgrowth) increased transit time. The
appendix is normal.

Vascular/Lymphatic: No significant vascular findings are present. No
enlarged abdominal or pelvic lymph nodes.

Reproductive: The uterus is anteverted. Prominence of the posterior
body myometrium may represent a fibroid or adenomyosis. There is a
1.5 cm corpus luteum in the left ovary. The right ovary is
unremarkable.

Other: Ventral hernia repair mesh noted. There is mild stranding of
the omental fat deep to the mesh (series 2, image 47) which may be
reactive. There is abutment of loops of bowel to the mesh at the
level of the umbilicus, likely degree of adhesion. There is an ovoid
1.5 x 3.0 cm fatty lesion with inflammatory changes in the umbilicus
which may represent an inflamed subcutaneous fat or inflamed small
fat containing hernia secondary to incarceration. Correlation with
clinical exam and point tenderness over the umbilicus recommended.
No fluid collection.

Musculoskeletal: No acute or significant osseous findings.
IMPRESSION: 1. Postsurgical changes of ventral hernia repair. Minimal stranding
of the mesentery and omentum along the posterior aspect of the mesh,
likely reactive. No fluid collection or abscess.
2. High attenuating fatty lesion within the umbilicus may represent
an inflamed umbilical fat or inflamed fat containing hernia
secondary to incarceration. Correlation with clinical exam and point
tenderness over the umbilicus recommended. No fluid collection.
3. No bowel obstruction or active inflammation.  Normal appendix.
4. A 1.5 cm left ovarian corpus luteum.

## 2018-08-02 ENCOUNTER — Other Ambulatory Visit: Payer: Self-pay

## 2018-08-02 ENCOUNTER — Encounter (HOSPITAL_BASED_OUTPATIENT_CLINIC_OR_DEPARTMENT_OTHER): Payer: Self-pay | Admitting: *Deleted

## 2018-08-02 ENCOUNTER — Emergency Department (HOSPITAL_BASED_OUTPATIENT_CLINIC_OR_DEPARTMENT_OTHER)
Admission: EM | Admit: 2018-08-02 | Discharge: 2018-08-02 | Disposition: A | Discharge status: Home / Self Care | Payer: Medicaid Other | Attending: Emergency Medicine | Admitting: Emergency Medicine

## 2018-08-02 DIAGNOSIS — M79671 Pain in right foot: Secondary | ICD-10-CM | POA: Insufficient documentation

## 2018-08-02 DIAGNOSIS — Z5321 Procedure and treatment not carried out due to patient leaving prior to being seen by health care provider: Secondary | ICD-10-CM | POA: Insufficient documentation

## 2018-08-02 NOTE — ED Triage Notes (Signed)
Pt c/o right heel pain x 3 days , denies

## 2020-02-24 ENCOUNTER — Emergency Department (HOSPITAL_COMMUNITY)
Admission: EM | Admit: 2020-02-24 | Discharge: 2020-02-25 | Disposition: A | Discharge status: Home / Self Care | Payer: Managed Care, Other (non HMO) | Attending: Emergency Medicine | Admitting: Emergency Medicine

## 2020-02-24 ENCOUNTER — Other Ambulatory Visit: Payer: Self-pay

## 2020-02-24 ENCOUNTER — Emergency Department (HOSPITAL_COMMUNITY): Payer: Managed Care, Other (non HMO)

## 2020-02-24 DIAGNOSIS — Y999 Unspecified external cause status: Secondary | ICD-10-CM | POA: Insufficient documentation

## 2020-02-24 DIAGNOSIS — Z79899 Other long term (current) drug therapy: Secondary | ICD-10-CM | POA: Diagnosis not present

## 2020-02-24 DIAGNOSIS — S8991XA Unspecified injury of right lower leg, initial encounter: Secondary | ICD-10-CM | POA: Diagnosis present

## 2020-02-24 DIAGNOSIS — Y93I9 Activity, other involving external motion: Secondary | ICD-10-CM | POA: Insufficient documentation

## 2020-02-24 DIAGNOSIS — Y92410 Unspecified street and highway as the place of occurrence of the external cause: Secondary | ICD-10-CM | POA: Insufficient documentation

## 2020-02-24 DIAGNOSIS — S8011XA Contusion of right lower leg, initial encounter: Secondary | ICD-10-CM | POA: Diagnosis not present

## 2020-02-24 MED ORDER — ACETAMINOPHEN 500 MG PO TABS
1000.0000 mg | ORAL_TABLET | Freq: Once | ORAL | Status: AC
  Administered 2020-02-24: 1000 mg via ORAL
  Filled 2020-02-24: qty 2

## 2020-02-24 NOTE — Discharge Instructions (Addendum)
Use ice, ibuprofen and Tylenol as needed for pain and swelling.  Gradually increase weightbearing as tolerated.  Return for new or worsening symptoms.

## 2020-02-24 NOTE — ED Notes (Signed)
Aundra Millet, sister, [phone] would like an update when available
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
## 2020-02-25 NOTE — ED Notes (Signed)
Discharge instructions reviewed with pt. Pt verbalized understanding.   
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
## 2021-10-30 LAB — OB RESULTS CONSOLE HGB/HCT, BLOOD
HCT: 33 (ref 29–41)
Hemoglobin: 10.7

## 2021-10-30 LAB — HEPATITIS C ANTIBODY: HCV Ab: NEGATIVE

## 2021-10-30 LAB — OB RESULTS CONSOLE PLATELET COUNT: Platelets: 362

## 2021-10-30 LAB — SICKLE CELL SCREEN: Sickle Cell Screen: NORMAL

## 2021-10-30 LAB — OB RESULTS CONSOLE HEPATITIS B SURFACE ANTIGEN: Hepatitis B Surface Ag: NEGATIVE

## 2021-10-30 LAB — OB RESULTS CONSOLE HIV ANTIBODY (ROUTINE TESTING): HIV: NONREACTIVE

## 2021-10-30 LAB — OB RESULTS CONSOLE RUBELLA ANTIBODY, IGM: Rubella: IMMUNE

## 2021-10-30 LAB — OB RESULTS CONSOLE RPR: RPR: NONREACTIVE

## 2021-11-04 ENCOUNTER — Telehealth: Payer: Self-pay | Admitting: Hematology and Oncology

## 2021-11-04 NOTE — Telephone Encounter (Signed)
Scheduled appt per 3/14 referral. Pt is aware of appt date and time. Pt is aware to arrive 15 mins prior to appt time and to bring and updated insurance card. Pt is aware of appt location.   ?

## 2021-11-06 ENCOUNTER — Other Ambulatory Visit: Payer: Self-pay

## 2021-11-06 ENCOUNTER — Ambulatory Visit
Admission: EM | Admit: 2021-11-06 | Discharge: 2021-11-06 | Disposition: A | Discharge status: Home / Self Care | Payer: 59

## 2021-11-06 DIAGNOSIS — J069 Acute upper respiratory infection, unspecified: Secondary | ICD-10-CM

## 2021-11-06 HISTORY — DX: Acute embolism and thrombosis of unspecified deep veins of unspecified lower extremity: I82.409

## 2021-11-06 NOTE — ED Provider Notes (Signed)
?[place] ? ? ? ?CSN: [id number] ?Arrival date & time: 11/06/21  1458 ? ? ?  ? ?History   ?Chief Complaint ?Chief Complaint  ?Patient presents with  ? Shortness of Breath  ? Nasal Congestion  ? ? ?HPI ?Tina Phillips is a 31 y.o. female.  ? ?Patient here today for evaluation of sinus pressure, runny nose that she has had for the last 4 days.  She reports she does have some shortness of breath when she is exerting herself as well.  She is currently [redacted] weeks pregnant.  She has not had fever.  She has tried using Benadryl Mucinex and Tylenol without significant relief. ? ?The history is provided by the patient.  ?Shortness of Breath ?Associated symptoms: cough   ?Associated symptoms: no abdominal pain, no ear pain, no fever, no sore throat, no vomiting and no wheezing   ? ?Past Medical History:  ?Diagnosis Date  ? Anemia   ? DVT (deep venous thrombosis) ([place])   ? Ventral hernia   ? ? ?Patient Active Problem List  ? Diagnosis Date Noted  ? Migraine without aura 07/18/2015  ? ? ?Past Surgical History:  ?Procedure Laterality Date  ? INSERTION OF MESH N/A 12/30/2017  ? Procedure: INSERTION OF MESH;  Surgeon: Clovis Riley, MD;  Location: [facility];  Service: General;  Laterality: N/A;  ? VENTRAL HERNIA REPAIR N/A 12/30/2017  ? Procedure: LAPAROSCOPIC VENTRAL HERNIA WITH MESH;  Surgeon: Clovis Riley, MD;  Location: [facility];  Service: General;  Laterality: N/A;  ? WISDOM TOOTH EXTRACTION    ? ? ?OB History   ? ? Gravida  ?1  ? Para  ?   ? Term  ?   ? Preterm  ?   ? AB  ?   ? Living  ?   ?  ? ? SAB  ?   ? IAB  ?   ? Ectopic  ?   ? Multiple  ?   ? Live Births  ?   ?   ?  ?  ? ? ? ?Home Medications   ? ?Prior to Admission medications   ?Medication Sig Start Date End Date Taking? Authorizing Provider  ?aspirin-acetaminophen-caffeine (EXCEDRIN MIGRAINE) 250-250-65 MG tablet Take 1-2 tablets by mouth every 6 (six) hours as needed for headache.     [provider]  ?Aspirin-Acetaminophen-Caffeine [phone]  MG PACK Take 2-3 packets by mouth daily as needed (for headache/migraine.). GOODY'S POWDERS    [provider]  ?Biotin 10000 MCG TABS Take 10,000 mcg by mouth daily.    [provider]  ?Multiple Vitamin (MULTIVITAMIN WITH MINERALS) TABS tablet Take 1 tablet by mouth daily.    [provider]  ? ? ?Family History ?Family History  ?Problem Relation Age of Onset  ? Diabetes Mother   ? Diabetes Father   ? Hyperlipidemia Father   ? Post-traumatic stress disorder Father   ? Prostate cancer Father   ? ? ?Social History ?Social History  ? ?Tobacco Use  ? Smoking status: Never  ? Smokeless tobacco: Never  ?Vaping Use  ? Vaping Use: Never used  ?Substance Use Topics  ? Alcohol use: No  ? Drug use: No  ? ? ? ?Allergies   ?Patient has no known allergies. ? ? ?Review of Systems ?Review of Systems  ?Constitutional:  Negative for chills and fever.  ?HENT:  Positive for congestion and sinus pressure. Negative for ear pain and sore throat.   ?Eyes:  Negative  for discharge and redness.  ?Respiratory:  Positive for cough and shortness of breath. Negative for wheezing.   ?Gastrointestinal:  Negative for abdominal pain, diarrhea, nausea and vomiting.  ? ? ?Physical Exam ?Triage Vital Signs ?ED Triage Vitals  ?Enc Vitals Group  ?   BP 11/06/21 1514 115/77  ?   Pulse Rate 11/06/21 1514 95  ?   Resp 11/06/21 1514 16  ?   Temp 11/06/21 1514 99.3 ?F (37.4 ?C)  ?   Temp Source 11/06/21 1514 Oral  ?   SpO2 11/06/21 1514 97 %  ?   Weight --   ?   Height --   ?   Head Circumference --   ?   Peak Flow --   ?   Pain Score 11/06/21 1512 0  ?   Pain Loc --   ?   Pain Edu? --   ?   Excl. in [place]? --   ? ?No data found. ? ?Updated Vital Signs ?BP 115/77 (BP Location: Left Arm)   Pulse 95   Temp 99.3 ?F (37.4 ?C) (Oral)   Resp 16   SpO2 97%  ? ?  ? ?Physical Exam ?Vitals and nursing note reviewed.  ?Constitutional:   ?   General: She is not in acute distress. ?   Appearance: Normal appearance. She is not ill-appearing.   ?HENT:  ?   Head: Normocephalic and atraumatic.  ?   Nose: Congestion present.  ?   Mouth/Throat:  ?   Mouth: Mucous membranes are moist.  ?   Pharynx: No oropharyngeal exudate or posterior oropharyngeal erythema.  ?Eyes:  ?   Conjunctiva/sclera: Conjunctivae normal.  ?Cardiovascular:  ?   Rate and Rhythm: Normal rate and regular rhythm.  ?   Heart sounds: Normal heart sounds. No murmur heard. ?Pulmonary:  ?   Effort: Pulmonary effort is normal. No respiratory distress.  ?   Breath sounds: Normal breath sounds. No wheezing, rhonchi or rales.  ?Skin: ?   General: Skin is warm and dry.  ?Neurological:  ?   Mental Status: She is alert.  ?Psychiatric:     ?   Mood and Affect: Mood normal.     ?   Thought Content: Thought content normal.  ? ? ? ?UC Treatments / Results  ?Labs ?(all labs ordered are listed, but only abnormal results are displayed) ?Labs Reviewed - No data to display ? ?EKG ? ? ?Radiology ?No results found. ? ?Procedures ?Procedures (including critical care time) ? ?Medications Ordered in UC ?Medications - No data to display ? ?Initial Impression / Assessment and Plan / UC Course  ?I have reviewed the triage vital signs and the nursing notes. ? ?Pertinent labs & imaging results that were available during my care of the patient were reviewed by me and considered in my medical decision making (see chart for details). ? ?  ?Discussed symptoms are likely related to viral etiology of symptoms and recommended continued symptomatic treatment.  Encouraged follow-up if symptoms worsen or fail to improve over the next several days.  Patient expresses understanding. ? ?Final Clinical Impressions(s) / UC Diagnoses  ? ?Final diagnoses:  ?Acute upper respiratory infection  ? ?Discharge Instructions   ?None ?  ? ?ED Prescriptions   ?None ?  ? ?PDMP not reviewed this encounter. ?  ?Francene Finders, PA-C ?11/06/21 1629 ? ?

## 2021-11-06 NOTE — ED Triage Notes (Signed)
Patient presents to Urgent Care with complaints of SOB, pressure in eyes, runny nose x 4 days. She states she is concerned with possible sinus infection. She states she is [redacted] weeks pregnant. Treating symptoms with benadryl, mucinex, and tylenol.  ? ?Denies fever.  ?

## 2021-11-19 ENCOUNTER — Inpatient Hospital Stay: Payer: No Typology Code available for payment source | Admitting: Physician Assistant

## 2021-11-19 ENCOUNTER — Telehealth: Payer: Self-pay | Admitting: Hematology and Oncology

## 2021-11-19 ENCOUNTER — Telehealth: Payer: Self-pay | Admitting: Physician Assistant

## 2021-11-19 ENCOUNTER — Inpatient Hospital Stay: Payer: No Typology Code available for payment source

## 2021-11-19 ENCOUNTER — Inpatient Hospital Stay: Payer: No Typology Code available for payment source | Admitting: Hematology and Oncology

## 2021-11-19 NOTE — Telephone Encounter (Signed)
Pt no showed her new hem appt. Called pt to r/s appt, no answer. Left msg for pt to call back to r/s appt.  ?

## 2021-11-19 NOTE — Telephone Encounter (Signed)
R/s appt to next available date that worked for pt. I did offer pt an earlier date appt, however she was unable to take it due to her own personal schedule. Pt is aware of new appt date and time.  ?

## 2021-12-02 ENCOUNTER — Telehealth: Payer: Self-pay | Admitting: Hematology and Oncology

## 2021-12-02 NOTE — Telephone Encounter (Signed)
Attempted to contact patient to confirm new Hem appt for this Friday. No answer so voicemail was left to call back to confirm  ?

## 2021-12-03 LAB — OB RESULTS CONSOLE ABO/RH: RH Type: POSITIVE

## 2021-12-03 LAB — OB RESULTS CONSOLE ANTIBODY SCREEN: Antibody Screen: NEGATIVE

## 2021-12-04 ENCOUNTER — Other Ambulatory Visit: Payer: Self-pay | Admitting: *Deleted

## 2021-12-04 ENCOUNTER — Inpatient Hospital Stay: Payer: 59

## 2021-12-04 ENCOUNTER — Other Ambulatory Visit: Payer: Self-pay

## 2021-12-04 ENCOUNTER — Encounter: Payer: Self-pay | Admitting: Hematology and Oncology

## 2021-12-04 ENCOUNTER — Inpatient Hospital Stay: Payer: 59 | Attending: Hematology and Oncology | Admitting: Hematology and Oncology

## 2021-12-04 VITALS — BP 122/70 | HR 86 | Temp 97.7°F | Resp 18 | Ht 67.0 in | Wt 215.1 lb

## 2021-12-04 DIAGNOSIS — D509 Iron deficiency anemia, unspecified: Secondary | ICD-10-CM

## 2021-12-04 DIAGNOSIS — Z86718 Personal history of other venous thrombosis and embolism: Secondary | ICD-10-CM | POA: Diagnosis not present

## 2021-12-04 DIAGNOSIS — Z79899 Other long term (current) drug therapy: Secondary | ICD-10-CM | POA: Diagnosis not present

## 2021-12-04 DIAGNOSIS — Z3A15 15 weeks gestation of pregnancy: Secondary | ICD-10-CM | POA: Insufficient documentation

## 2021-12-04 DIAGNOSIS — O2232 Deep phlebothrombosis in pregnancy, second trimester: Secondary | ICD-10-CM | POA: Diagnosis not present

## 2021-12-04 DIAGNOSIS — I82441 Acute embolism and thrombosis of right tibial vein: Secondary | ICD-10-CM | POA: Insufficient documentation

## 2021-12-04 MED ORDER — ENOXAPARIN SODIUM 40 MG/0.4ML IJ SOSY: 40.0000 mg | PREFILLED_SYRINGE | INTRAMUSCULAR | 1 refills | Status: DC

## 2021-12-04 NOTE — Progress Notes (Signed)
[facility] ?CONSULT NOTE ? ?Patient Care Team: ?Norm Salt, PA as PCP - General (Physician Assistant) ? ?CHIEF COMPLAINTS/PURPOSE OF CONSULTATION:  ? ?ASSESSMENT & PLAN:  ? 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  She denies any new complaints concerning for new DVT.  No lower extremity swelling or chest pain or shortness of breath.  She does admit to a family history of DVT in both mom and dad.  Since she had provoked DVT secondary to estrogen-based contraception, I do believe she will benefit from at least prophylactic dose of anticoagulation during pregnancy.  I have clearly explained to her that women are at high risk for DVT/PE during pregnancy and immediate postpartum and she should consider Lovenox prophylaxis during pregnancy and immediate postpartum for up to 6 weeks.  We have discussed about risks of bleeding both for her and her fetus which are very rare. ?She understands that blood thinners do not cause spontaneous bleeding but if she has a reason to bleed she will bleed more than expected.  Prescription for Lovenox has been dispensed to her pharmacy.  She can get future refills from her OB/GYN.   ? ?With regards to her iron deficiency anemia, recommended oral iron supplementation at least once a day ferrous sulfate every day in addition to prenatal vitamins.  Since her hemoglobin is still at around 10 g, I do not see any immediate need for intravenous iron. ?She expressed understanding of all the recommendations.  We will give clear instructions how to administer the Lovenox and she expressed understanding of all the plan.  She was recommended to return to clinic in about 3 months. ? ?HISTORY OF PRESENTING ILLNESS:  ?Tina Phillips 31 y.o. female is here because of history of DVT ? ? ?This is a very pleasant  30-year ? ?-old female patient currently at 42 weeks of gestation with past medical history significant for right lower extremity posterior tibial vein DVT back in 2019 when she took anticoagulation with Xarelto for about 3 to 6 months who is referred to hematology for recommendations regarding anticoagulation in pregnancy.  Patient was on oral contraceptives at the time of her DVT.  She also mentions family history of DVT in mom and dad.  She has not had any other episodes of DVT or PE since her 2019 episode.  She also has history of iron deficiency anemia however very reluctant to take oral iron.  She otherwise is doing well the pregnancy.  No concerns.  Rest of the pertinent 10 point ROS reviewed and negative. ? ?REVIEW OF SYSTEMS:   ?Constitutional: Denies fevers, chills or abnormal night sweats ?Eyes: Denies blurriness of vision, double vision or watery eyes ?Ears, nose, mouth, throat, and face: Denies mucositis or sore throat ?Respiratory: Denies cough, dyspnea or wheezes ?Cardiovascular: Denies palpitation, chest discomfort or lower extremity swelling ?Gastrointestinal:  Denies nausea, heartburn or change in bowel habits ?Skin: Denies abnormal skin rashes ?Lymphatics: Denies new lymphadenopathy or easy bruising ?Neurological:Denies numbness, tingling or new weaknesses ?Behavioral/Psych: Mood is stable, no new changes  ?All other systems were reviewed with the patient and are negative. ? ?MEDICAL HISTORY:  ?Past Medical History:  ?Diagnosis Date  ? Anemia   ? DVT (deep venous thrombosis) ([facility])   ? Ventral hernia   ? ? ?SURGICAL HISTORY: ?Past Surgical History:  ?Procedure Laterality Date  ?  INSERTION OF MESH N/A 12/30/2017  ? Procedure: INSERTION OF MESH;  Surgeon: Berna Bueonnor, Chelsea A, MD;  [facility];  Service: General;  Laterality: N/A;  ? VENTRAL HERNIA REPAIR N/A 12/30/2017  ? Procedure: LAPAROSCOPIC VENTRAL HERNIA WITH MESH;  Surgeon: Berna Bueonnor, Chelsea A, MD;  [facility];  Service: General;   Laterality: N/A;  ? WISDOM TOOTH EXTRACTION    ? ? ?SOCIAL HISTORY: ?Social History  ? ?Socioeconomic History  ? Marital status: Single  ?  Spouse name: Not on file  ? Number of children: Not on file  ? Years of education: Not on file  ? Highest education level: Not on file  ?Occupational History  ? Occupation: retail  ?  Employer: [employer]  ?Tobacco Use  ? Smoking status: Never  ? Smokeless tobacco: Never  ?Vaping Use  ? Vaping Use: Never used  ?Substance and Sexual Activity  ? Alcohol use: No  ? Drug use: No  ? Sexual activity: Yes  ?  Partners: Male  ?Other Topics Concern  ? Not on file  ?Social History Narrative  ? Lives with mom, dad, brother and sister  ? Student at [employer] - arts degree - wants to transfer to [employer] - wants to be an event planner  ? 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 ?Social Determinants of Health  ? ?Financial Resource Strain: Not on file  ?Food Insecurity: Not on file  ?Transportation Needs: Not on file  ?Physical Activity: Not on file  ?Stress: Not on file  ?Social Connections: Not on file  ?Intimate Partner Violence: Not on file  ? ? ?FAMILY HISTORY: ?Family History  ?Problem Relation Age of Onset  ? Diabetes Mother   ? Diabetes Father   ? Hyperlipidemia Father   ? Post-traumatic stress disorder Father   ? Prostate cancer Father   ? ? ?ALLERGIES:  has No Known Allergies. ? ?MEDICATIONS:  ?Current Outpatient Medications  ?Medication Sig Dispense Refill  ? enoxaparin (LOVENOX) 40 MG/0.4ML injection Inject 0.4 mLs (40 mg total) into the skin daily. 12 mL 1  ? aspirin-acetaminophen-caffeine (EXCEDRIN MIGRAINE) 250-250-65 MG tablet Take 1-2 tablets by mouth every 6 (six) hours as needed for headache.     ? Aspirin-Acetaminophen-Caffeine 500-325-65 MG PACK Take 2-3 packets by mouth daily as needed (for headache/migraine.). GOODY'S POWDERS    ? Biotin [id number] MCG TABS Take 10,000 mcg by mouth daily.    ? Multiple Vitamin (MULTIVITAMIN WITH MINERALS) TABS tablet Take 1 tablet by mouth daily.    ? ?No current  facility-administered medications for this visit.  ? ? ? ?PHYSICAL EXAMINATION: ?ECOG PERFORMANCE STATUS: 0 - Asymptomatic ? ?Vitals:  ? 12/04/21 1029  ?BP: 122/70  ?Pulse: 86  ?Resp: 18  ?Temp: 97.7 ?F (36.5 ?C)  ?SpO2: 98%  ? ?Filed Weights  ? 12/04/21 1029  ?Weight: 215 lb 1.6 oz (97.6 kg)  ? ? ?GENERAL:alert, no distress and comfortable ?SKIN: skin color, texture, turgor are normal, no rashes or significant lesions ?EYES: normal, conjunctiva are pink and non-injected, sclera clear ?OROPHARYNX:no exudate, no erythema and lips, buccal mucosa, and tongue normal  ?NECK: supple, thyroid normal size, non-tender, without nodularity ?LYMPH:  no palpable lymphadenopathy in the cervical, axillary ?LUNGS: clear to auscultation and percussion with normal breathing effort ?HEART: regular rate & rhythm and no murmurs and no lower extremity edema ?ABDOMEN: Gravid uterus.  No hepatosplenomegaly ?Musculoskeletal:no cyanosis of digits and no clubbing  ?PSYCH: alert & oriented x 3 with fluent speech ?NEURO: no focal motor/sensory deficits ? ?  LABORATORY DATA:  ?I have reviewed the data as listed ?Lab Results  ?Component Value Date  ? WBC 7.7 02/17/2018  ? HGB 11.8 (L) 02/17/2018  ? HCT 38.2 02/17/2018  ? MCV 84.1 02/17/2018  ? PLT 329 02/17/2018  ? ?  Chemistry   ?   ?Component Value Date/Time  ? NA 140 02/17/2018 1552  ? K 3.8 02/17/2018 1552  ? CL 107 02/17/2018 1552  ? CO2 26 02/17/2018 1552  ? BUN 10 02/17/2018 1552  ? CREATININE 0.84 02/17/2018 1552  ?    ?Component Value Date/Time  ? CALCIUM 8.8 (L) 02/17/2018 1552  ? ALKPHOS 49 02/17/2018 1552  ? AST 17 02/17/2018 1552  ? ALT 12 02/17/2018 1552  ? BILITOT 0.4 02/17/2018 1552  ?  ? ?April 2021 no evidence of DVT on vascular evaluation of lower extremity ?Duplex August 03, 2018 showed acute DVT in the right posterior tibial vein CTPA negative ? ?RADIOGRAPHIC STUDIES: ?I have personally reviewed the radiological images as listed and agreed with the findings in the report. ?No  results found. ? ?All questions were answered. The patient knows to call the clinic with any problems, questions or concerns. ?I spent 45 minutes in the care of this patient including H and P, review of records, counseli

## 2021-12-30 ENCOUNTER — Inpatient Hospital Stay (HOSPITAL_COMMUNITY)
Admission: AD | Admit: 2021-12-30 | Discharge: 2021-12-30 | Disposition: A | Discharge status: Home / Self Care | Payer: 59 | Attending: Obstetrics and Gynecology | Admitting: Obstetrics and Gynecology

## 2021-12-30 ENCOUNTER — Telehealth (INDEPENDENT_AMBULATORY_CARE_PROVIDER_SITE_OTHER): Payer: 59

## 2021-12-30 ENCOUNTER — Encounter (HOSPITAL_COMMUNITY): Payer: Self-pay | Admitting: Obstetrics and Gynecology

## 2021-12-30 DIAGNOSIS — O0990 Supervision of high risk pregnancy, unspecified, unspecified trimester: Secondary | ICD-10-CM

## 2021-12-30 DIAGNOSIS — M545 Low back pain, unspecified: Secondary | ICD-10-CM | POA: Diagnosis not present

## 2021-12-30 DIAGNOSIS — D649 Anemia, unspecified: Secondary | ICD-10-CM | POA: Insufficient documentation

## 2021-12-30 DIAGNOSIS — Z3A00 Weeks of gestation of pregnancy not specified: Secondary | ICD-10-CM

## 2021-12-30 DIAGNOSIS — Z3A19 19 weeks gestation of pregnancy: Secondary | ICD-10-CM | POA: Diagnosis not present

## 2021-12-30 DIAGNOSIS — O26899 Other specified pregnancy related conditions, unspecified trimester: Secondary | ICD-10-CM

## 2021-12-30 DIAGNOSIS — R102 Pelvic and perineal pain: Secondary | ICD-10-CM | POA: Diagnosis not present

## 2021-12-30 DIAGNOSIS — Z86718 Personal history of other venous thrombosis and embolism: Secondary | ICD-10-CM | POA: Insufficient documentation

## 2021-12-30 DIAGNOSIS — O26892 Other specified pregnancy related conditions, second trimester: Secondary | ICD-10-CM | POA: Diagnosis present

## 2021-12-30 DIAGNOSIS — O99891 Other specified diseases and conditions complicating pregnancy: Secondary | ICD-10-CM

## 2021-12-30 DIAGNOSIS — O99119 Other diseases of the blood and blood-forming organs and certain disorders involving the immune mechanism complicating pregnancy, unspecified trimester: Secondary | ICD-10-CM

## 2021-12-30 DIAGNOSIS — I82409 Acute embolism and thrombosis of unspecified deep veins of unspecified lower extremity: Secondary | ICD-10-CM

## 2021-12-30 HISTORY — DX: Supervision of high risk pregnancy, unspecified, unspecified trimester: O09.90

## 2021-12-30 LAB — WET PREP, GENITAL
Clue Cells Wet Prep HPF POC: NONE SEEN
Sperm: NONE SEEN
Trich, Wet Prep: NONE SEEN
WBC, Wet Prep HPF POC: <10 (ref <10)
Yeast Wet Prep HPF POC: NONE SEEN

## 2021-12-30 LAB — URINALYSIS, ROUTINE W REFLEX MICROSCOPIC
Bilirubin Urine: NEGATIVE
Glucose, UA: NEGATIVE mg/dL
Hgb urine dipstick: NEGATIVE
Ketones, ur: NEGATIVE mg/dL
Leukocytes,Ua: NEGATIVE
Nitrite: NEGATIVE
Protein, ur: 30 mg/dL — AB
Specific Gravity, Urine: 1.027 (ref 1.005–1.030)
pH: 5 (ref 5.0–8.0)

## 2021-12-30 NOTE — MAU Provider Note (Signed)
?History  ?  ? ?CSN: [id number] ? ?Arrival date and time: 12/30/21 1622 ? ? None  ?  ?Chief Complaint  ?Patient presents with  ? Abdominal Pain  ? Back Pain  ? ?HPI ?Tina Phillips is a 31 y.o. G1P0 at [redacted]w[redacted]d by LMP who presents to MAU for low back pain and pelvic pressure. Patient reports low back pain started last night. Pain is constant. She has not taken anything to relieve pain but pain does improve when she lies down. 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 She is also endorsing ongoing intermittent pelvic pressure. She denies urinary s/s, vaginal discharge, itching/irritation, bleeding. No constipation or diarrhea. She is concerned because she saw her PCP who she reports told her "she was concerned because her hcg levels were dropping" and is worried something is wrong with pregnancy. Patient reports she had an ultrasound early in pregnancy, although I am unable to see those records. She reports her LMP was 05/26/2021. She is scheduled to have an ultrasound on 5/16 and has a NOB at [facility] on 5/22.  ? ? ?OB History   ? ? Gravida  ?1  ? Para  ?   ? Term  ?   ? Preterm  ?   ? AB  ?   ? Living  ?   ?  ? ? SAB  ?   ? IAB  ?   ? Ectopic  ?   ? Multiple  ?   ? Live Births  ?   ?   ?  ?  ? ? ?Past Medical History:  ?Diagnosis Date  ? Anemia   ? DVT (deep venous thrombosis) ([place])   ? Ventral hernia   ? ? ?Past Surgical History:  ?Procedure Laterality Date  ? INSERTION OF MESH N/A 12/30/2017  ? Procedure: INSERTION OF MESH;  Surgeon: Clovis Riley, MD;  Location: [place];  Service: General;  Laterality: N/A;  ? POLYPECTOMY    ? VENTRAL HERNIA REPAIR N/A 12/30/2017  ? Procedure: LAPAROSCOPIC VENTRAL HERNIA WITH MESH;  Surgeon: Clovis Riley, MD;  Location: [place];  Service: General;  Laterality: N/A;  ? WISDOM TOOTH EXTRACTION    ? ? ?Family History  ?Problem Relation Age of Onset  ? Diabetes Mother   ? Deep vein thrombosis Mother   ? Congestive Heart Failure Mother    ? Diabetes Father   ? Hyperlipidemia Father   ? Post-traumatic stress disorder Father   ? Prostate cancer Father   ? Deep vein thrombosis Father   ? Cancer - Prostate Father   ? ? ?Social History  ? ?Tobacco Use  ? Smoking status: Never  ? Smokeless tobacco: Never  ?Vaping Use  ? Vaping Use: Never used  ?Substance Use Topics  ? Alcohol use: No  ? Drug use: No  ? ? ?Allergies: No Known Allergies ? ?Medications Prior to Admission  ?Medication Sig Dispense Refill Last Dose  ? enoxaparin (LOVENOX) 40 MG/0.4ML injection Inject 0.4 mLs (40 mg total) into the skin daily. 12 mL 1 12/29/2021  ? ferrous sulfate 325 (65 FE) MG EC tablet Take 325 mg by mouth 3 (three) times daily with meals.   12/29/2021  ? Multiple Vitamin (MULTIVITAMIN WITH MINERALS) TABS tablet Take 1 tablet by mouth daily.   12/30/2021  ? aspirin-acetaminophen-caffeine (EXCEDRIN MIGRAINE) 250-250-65 MG tablet Take 1-2 tablets by mouth every 6 (six) hours as needed for headache.  (  Patient not taking: Reported on 12/30/2021)     ? Aspirin-Acetaminophen-Caffeine 500-325-65 MG PACK Take 2-3 packets by mouth daily as needed (for headache/migraine.). GOODY'S POWDERS (Patient not taking: Reported on 12/30/2021)     ? Biotin 10000 MCG TABS Take 10,000 mcg by mouth daily.     ? ? ?Review of Systems  ?Constitutional: Negative.   ?Respiratory: Negative.    ?Cardiovascular: Negative.   ?Gastrointestinal: Negative.   ?Genitourinary:  Positive for pelvic pain.  ?Musculoskeletal:  Positive for back pain.  ?Neurological: Negative.   ? ?Physical Exam  ? ?Blood pressure 115/65, pulse 90, temperature 98.5 ?F (36.9 ?C), temperature source Oral, resp. rate 18, height 5\' 7"  (1.702 m), weight 97.1 kg, last menstrual period 08/19/2021, SpO2 98 %. ? ?Physical Exam ?Vitals and nursing note reviewed. Exam conducted with a chaperone present.  ?Constitutional:   ?   General: She is not in acute distress. ?Cardiovascular:  ?   Rate and Rhythm: Normal rate.  ?Pulmonary:  ?   Effort: Pulmonary  effort is normal.  ?Abdominal:  ?   Palpations: Abdomen is soft.  ?   Tenderness: There is no abdominal tenderness.  ?   Comments: FH at umbilicus  ?Genitourinary: ?   Comments: Blind swabs collected ?VE: closed/thick ?Skin: ?   General: Skin is warm and dry.  ?Neurological:  ?   General: No focal deficit present.  ?   Mental Status: She is alert and oriented to person, place, and time.  ?Psychiatric:     ?   Mood and Affect: Mood normal.     ?   Behavior: Behavior normal.  ? ?FHT: 140 bpm via doppler ? ?MAU Course  ?Procedures ? ?MDM ?Fetal heart tones present. UA, culture pending. Wet prep negative, GC/CT pending. Cervix closed/thick. Patient offered Tylenol for back pain but declines. A heating pad was given while waiting for results with some improvement. Reassurance provided.  ? ?Assessment and Plan  ?[redacted] weeks gestation of pregnancy ?Back pain affecting pregnancy ?Pelvic pain affecting pregnancy ? ?- Discharge home in stable condition ?- Strict return precautions reviewed. Return to MAU as needed for worsening symptoms ?- Keep [place] appt as scheduled on 5/16. Keep NOB appt as scheduled on 5/22.  ? ? ?Renee Harder, CNM ?12/30/2021, 6:27 PM  ?

## 2021-12-30 NOTE — Progress Notes (Signed)
New OB Intake ? ?I connected with  [name] on 12/30/21 at  8:15 AM EDT by MyChart Video Visit and verified that I am speaking with the correct person using two identifiers. Nurse is located at [facility] and pt is located at home. ? ?I discussed the limitations, risks, security and privacy concerns of performing an evaluation and management service by telephone and the availability of in person appointments. I also discussed with the patient that there may be a patient responsible charge related to this service. The patient expressed understanding and agreed to proceed. ? ?I explained I am completing New OB Intake today. We discussed her EDD of 05/26/2022 that is based on LMP of 08/19/2021. Pt is G1/P0. I reviewed her allergies, medications, Medical/Surgical/OB history, and appropriate screenings. I informed her of [facility] services. Based on history, this is a/an  pregnancy:   ?Patient Active Problem List  ? Diagnosis Date Noted  ? Supervision of high risk pregnancy, antepartum 12/30/2021  ? DVT (deep venous thrombosis) ([facility]) 12/30/2021  ? Anemia 12/30/2021  ? Migraine without aura 07/18/2015  ?  ? ?Concerns addressed today: ?-Patient is on Lovenox injection for her DVT ?-Patient had prenatal care at  Dr. Nena Jordan A. Cousins MD for her initial OB cares, request to fill out a ROI to have her records. Patient agreed to the plan. ?-Patient stated the last time she saw Dr. Cherly Hensen MD for her OB care was 04/13, she went to her primary care and her hcg level from her blood drawn was 5-6 weeks, and now she is currently waiting from her primary care to call her once her hcg level result come back from her recent visit with them on 12/29/2021. ?CMA spoke to Dr. Macon Large, scheduled her a viability scan on 05/16, and patient is aware of her the plan. Patient stated she is not having pain and no vaginal bleeding.  ? ?Delivery Plans:  ?Plans to deliver at [facility].  ? ?MyChart/Babyscripts ?MyChart access verified. I explained  pt will have some visits in office and some virtually. Babyscripts instructions given and order placed. Patient verifies receipt of registration text/e-mail. Account successfully created and app downloaded. ? ?Blood Pressure Cuff  ?Patient has private insurance; instructed to purchase blood pressure cuff and bring to first prenatal appt. Explained after first prenatal appt pt will check weekly and document in Babyscripts. ? ?Weight scale: Patient does not  have weight scale. Weight scale ordered for patient to pick up from [employer].  ? ?Anatomy US ?Explained first scheduled [place] will be around 19 weeks. Anatomy US scheduled for 01/29/2022 at 09:00. Pt notified to arrive at 8:45am. ?Scheduled AFP lab only appointment if CenteringPregnancy pt for same day as anatomy US.  ? ?Labs ?Discussed Avelina Laine genetic screening with patient. Would like both Panorama and Horizon drawn at new OB visit.Also if interested in genetic testing, tell patient she will need AFP 15-21 weeks to complete genetic testing .Routine prenatal labs needed. ? ?Covid Vaccine ?Patient has covid vaccine.  ? ?Is patient a CenteringPregnancy candidate?  ?Declined ?Declined due to Support Person Concern ?Not a candidate due to NAIs patient a CenteringPregnancy candidate?  ?  ?Is patient a Mom+Baby Combined Care candidate?  ?Declined   ? ?  ?Is patient interested in [place]?  ?No  ? ? ?Informed patient of [facility] website  and placed link in her AVS.  ? ?Social Determinants of Health ?Food Insecurity: Patient denies food insecurity. ?WIC Referral: Patient is interested in  referral to [facility].  ?Transportation: Patient denies transportation needs. ?Childcare: Discussed no children allowed at ultrasound appointments. Offered childcare services; patient declines childcare services at this time. ? ?Send link to Pregnancy Navigators ? ? ?Placed OB Box on problem list and updated ? ?First visit review ?I reviewed new OB appt with pt. I explained if  record do not show pap smear, will may do a pap smear exam.  Explained pt will be seen by Jaynie Collins MD at first visit; encounter routed to appropriate provider. Explained that patient will be seen by pregnancy navigator following visit with provider. [facility] information placed in AVS.  ? ?Vidal Schwalbe, CMA ?12/30/2021  8:49 AM  ?

## 2021-12-30 NOTE — MAU Note (Signed)
Tina Phillips is a 31 y.o. at [redacted]w[redacted]d here in MAU reporting: been having some cramp/pressure in lower abd, having a piercing pain in her back. Has seen primary dr twice, did blood work both times.  Had already seen an OB, had an [place] they saw an IUP with a heartbeat. (Dr Cousin's office) Has changed dr's - is in transition, has appt on 5/22 with Dr Ephriam Jenkins. No bleeding or d/c. ? ?Onset of complaint: few days ?Pain score: 6/7 ?Vitals:  ? 12/30/21 1640  ?BP: 115/65  ?Pulse: 90  ?Resp: 18  ?Temp: 98.5 ?F (36.9 ?C)  ?SpO2: 98%  ?   ?FHT:140 ?Lab orders placed from triage:  urine ?

## 2021-12-30 NOTE — Discharge Instructions (Signed)

## 2021-12-31 LAB — CULTURE, OB URINE: Culture: <10000 — AB

## 2021-12-31 LAB — GC/CHLAMYDIA PROBE AMP (~~LOC~~) NOT AT ARMC
Chlamydia: NEGATIVE
Comment: NEGATIVE
Comment: NORMAL
Neisseria Gonorrhea: NEGATIVE

## 2022-01-04 ENCOUNTER — Encounter: Payer: Self-pay | Admitting: *Deleted

## 2022-01-05 ENCOUNTER — Ambulatory Visit: Payer: Self-pay

## 2022-01-05 ENCOUNTER — Other Ambulatory Visit: Payer: Self-pay | Admitting: General Practice

## 2022-01-05 ENCOUNTER — Ambulatory Visit (INDEPENDENT_AMBULATORY_CARE_PROVIDER_SITE_OTHER): Payer: 59

## 2022-01-05 DIAGNOSIS — Z3490 Encounter for supervision of normal pregnancy, unspecified, unspecified trimester: Secondary | ICD-10-CM

## 2022-01-11 ENCOUNTER — Ambulatory Visit (INDEPENDENT_AMBULATORY_CARE_PROVIDER_SITE_OTHER): Payer: 59 | Admitting: Obstetrics & Gynecology

## 2022-01-11 ENCOUNTER — Encounter: Payer: Self-pay | Admitting: Obstetrics & Gynecology

## 2022-01-11 ENCOUNTER — Telehealth: Payer: Self-pay | Admitting: Hematology and Oncology

## 2022-01-11 VITALS — BP 104/70 | HR 75 | Wt 212.0 lb

## 2022-01-11 DIAGNOSIS — Z86718 Personal history of other venous thrombosis and embolism: Secondary | ICD-10-CM

## 2022-01-11 DIAGNOSIS — O99210 Obesity complicating pregnancy, unspecified trimester: Secondary | ICD-10-CM

## 2022-01-11 DIAGNOSIS — Z3A20 20 weeks gestation of pregnancy: Secondary | ICD-10-CM

## 2022-01-11 DIAGNOSIS — Z8759 Personal history of other complications of pregnancy, childbirth and the puerperium: Secondary | ICD-10-CM

## 2022-01-11 DIAGNOSIS — O0990 Supervision of high risk pregnancy, unspecified, unspecified trimester: Secondary | ICD-10-CM

## 2022-01-11 MED ORDER — ASPIRIN 81 MG PO TBEC: 81.0000 mg | DELAYED_RELEASE_TABLET | Freq: Every day | ORAL | 2 refills | Status: DC

## 2022-01-11 NOTE — Telephone Encounter (Signed)
Rescheduled appointment per providers template. Left message.  ? ?

## 2022-01-11 NOTE — Progress Notes (Signed)
   PRENATAL VISIT NOTE  Subjective:  Tina Phillips is a 31 y.o. G1P0 at [redacted]w[redacted]d being seen today for transfer of prenatal care from Dr. Domenick Gong  office.  She is currently monitored for the following issues for this high-risk pregnancy and has Migraine without aura; Supervision of high risk pregnancy, antepartum; History of maternal deep vein thrombosis (DVT); and Anemia on their problem list.  Patient had a right posterior tibial vein DVT in 2019 while on oral contraceptives . She is currently on Lovenox 40 [place] qd, followed by Hematology.  Patient reports  occasional low back pain .  Contractions: Not present. Vag. Bleeding: None.  Movement: Absent. Denies leaking of fluid.   The following portions of the patient's history were reviewed and updated as appropriate: allergies, current medications, past family history, past medical history, past social history, past surgical history and problem list.   Objective:   Vitals:   01/11/22 1404  BP: 104/70  Pulse: 75  Weight: 212 lb (96.2 kg)    Fetal Status: Fetal Heart Rate (bpm): 138   Movement: Absent     General:  Alert, oriented and cooperative. Patient is in no acute distress.  Skin: Skin is warm and dry. No rash noted.   Cardiovascular: Normal heart rate noted  Respiratory: Normal respiratory effort, no problems with respiration noted  Abdomen: Soft, gravid, appropriate for gestational age.  Pain/Pressure: Absent     Pelvic: Cervical exam deferred        Extremities: Normal range of motion.  Edema: Trace  Mental Status: Normal mood and affect. Normal behavior. Normal judgment and thought content.   Assessment and Plan:  Pregnancy: G1P0 at [redacted]w[redacted]d 1. History of maternal deep vein thrombosis (DVT) Continue prophylactic Lovenox throughout pregnancy until six weeks postpartum.  2. Obesity in pregnancy, antepartum Recommended TWG 11-20 lbs. ASA prescribed.  Labs checked.  - aspirin EC 81 MG tablet; Take 1 tablet (81 mg total) by mouth  daily. Take after 12 weeks for prevention of preeclampsia later in pregnancy  Dispense: 300 tablet; Refill: 2 - Comprehensive metabolic panel - TSH - Hemoglobin A1c  3. [redacted] weeks gestation of pregnancy 4. Supervision of high risk pregnancy, antepartum LR NIPS as per patient. AFP today. Already scheduled for detailed anatomy scan at MFM - AFP, Serum, Open Spina Bifida  The nature of [place] with multiple MDs and other Advanced Practice Providers was explained to patient; also emphasized that residents, students are part of our team.  Preterm labor symptoms and general obstetric precautions including but not limited to vaginal bleeding, contractions, leaking of fluid and fetal movement were reviewed in detail with the patient. Please refer to After Visit Summary for other counseling recommendations.   Return in about 4 weeks (around 02/08/2022) for OFFICE OB VISIT (MD only)  8 weeks from now: 2 hr GTT, 3rd trimester labs, OFFICE OB VISIT (MD).  Future Appointments  Date Time Provider [place]  01/29/2022  8:45 AM [facility] NURSE [facility]  01/29/2022  9:00 AM [facility] US1 [facility]  03/16/2022  9:15 AM Benay Pike, MD [facility] None    Verita Schneiders, MD

## 2022-01-13 LAB — COMPREHENSIVE METABOLIC PANEL
ALT: 28 IU/L (ref 0–32)
AST: 19 IU/L (ref 0–40)
Albumin/Globulin Ratio: 1.5 (ref 1.2–2.2)
Albumin: 4.1 g/dL (ref 3.9–5.0)
Alkaline Phosphatase: 57 IU/L (ref 44–121)
BUN/Creatinine Ratio: 10 (ref 9–23)
BUN: 7 mg/dL (ref 6–20)
Bilirubin Total: <0.2 mg/dL (ref 0.0–1.2)
CO2: 21 mmol/L (ref 20–29)
Calcium: 10.1 mg/dL (ref 8.7–10.2)
Chloride: 103 mmol/L (ref 96–106)
Creatinine, Ser: 0.68 mg/dL (ref 0.57–1.00)
Globulin, Total: 2.7 g/dL (ref 1.5–4.5)
Glucose: 84 mg/dL (ref 70–99)
Potassium: 4.2 mmol/L (ref 3.5–5.2)
Sodium: 138 mmol/L (ref 134–144)
Total Protein: 6.8 g/dL (ref 6.0–8.5)
eGFR: 121 mL/min/{1.73_m2} (ref >59)

## 2022-01-13 LAB — TSH: TSH: 0.762 u[IU]/mL (ref 0.450–4.500)

## 2022-01-13 LAB — AFP, SERUM, OPEN SPINA BIFIDA
AFP MoM: 1.02
AFP Value: 57.9 ng/mL
Gest. Age on Collection Date: 20.7 weeks
Maternal Age At EDD: 30.1 yr
OSBR Risk 1 IN: 10000
Test Results:: NEGATIVE
Weight: 212 [lb_av]

## 2022-01-13 LAB — HEMOGLOBIN A1C
Est. average glucose Bld gHb Est-mCnc: 114 mg/dL
Hgb A1c MFr Bld: 5.6 % (ref 4.8–5.6)

## 2022-01-29 ENCOUNTER — Ambulatory Visit: Payer: 59 | Attending: Obstetrics & Gynecology

## 2022-01-29 ENCOUNTER — Ambulatory Visit: Payer: 59 | Admitting: *Deleted

## 2022-01-29 ENCOUNTER — Other Ambulatory Visit: Payer: Self-pay | Admitting: *Deleted

## 2022-01-29 ENCOUNTER — Ambulatory Visit: Payer: 59 | Attending: Obstetrics and Gynecology | Admitting: Obstetrics and Gynecology

## 2022-01-29 VITALS — BP 112/70 | HR 83

## 2022-01-29 DIAGNOSIS — O99212 Obesity complicating pregnancy, second trimester: Secondary | ICD-10-CM

## 2022-01-29 DIAGNOSIS — O0990 Supervision of high risk pregnancy, unspecified, unspecified trimester: Secondary | ICD-10-CM | POA: Insufficient documentation

## 2022-01-29 DIAGNOSIS — Z86718 Personal history of other venous thrombosis and embolism: Secondary | ICD-10-CM | POA: Diagnosis not present

## 2022-01-29 DIAGNOSIS — Z3A23 23 weeks gestation of pregnancy: Secondary | ICD-10-CM

## 2022-01-29 DIAGNOSIS — Z6834 Body mass index (BMI) 34.0-34.9, adult: Secondary | ICD-10-CM

## 2022-01-29 NOTE — Progress Notes (Signed)
Maternal-Fetal Medicine   Name: Tina Phillips DOB: Nov 29, 1990 MRN: [id number] Referring Provider: Jaynie Collins, MD  I had the pleasure of seeing Ms. Maulding today at [facility]. She is G1 P0 at 23w 5d gestation and is here for fetal anatomy scan. On cell-free fetal DNA screening, the risks of fetal aneuploidies are not increased.  MSAFP screening showed low risk for open neural tube defects. Patient does not have hypertension or diabetes.  In 2019, she had deep vein thrombosis (DVT) significant for deep vein thrombosis (leg).  She reports taking oral contraceptives when she had DVT. Patient takes enoxaparin 40 mg daily for prophylaxis. No family history of thrombophilia.  We performed fetal anatomical survey.  Amniotic fluid is normal and good fetal activity seen.  Fetal biometry is consistent with the previously established dates.  No markers of aneuploidy's or fetal structural defects are seen. As maternal obesity imposes limitations of the resolution of images, fetal anomalies may be missed.  History of DVT I explained that pregnancy increases the risk of VTE to about 4 to 5 times, but the absolute risk is very small. Patient has a history of VTE that was associated with the use of oral contraceptive (estrogen-related).  I informed her that given her history of DVT is probably from hormonal cause (oral contraceptives) and that prophylactic anticoagulation in this pregnancy will reduce the likelihood of developing DVT.  I recommend continuing for 6 weeks postpartum. I also advised her on prevention of risk factors including prolonged immobility (travel). She does not have risk factors of smoking or obesity.  Recommendations: -An appointment was made for her to return in 4 weeks for completion of fetal anatomy. -Continue lovenox 40 mg daily till 36 weeks. -Unfractionated heparin 10,000 units subcut twice daily from 36 weeks until delivery. -Resume lovenox 40 mg daily  at 4 to 6 hours after vaginal delivery or 8 to 12 hours after cesarean section. -Calcium supplements.  Thank you for consultation.  If you have any questions or concerns, please contact me the [facility].  Consultation including face-to-face (more than 50%) counseling 30 minutes.

## 2022-02-10 ENCOUNTER — Other Ambulatory Visit: Payer: Self-pay

## 2022-02-10 ENCOUNTER — Other Ambulatory Visit: Payer: 59

## 2022-02-10 ENCOUNTER — Ambulatory Visit (INDEPENDENT_AMBULATORY_CARE_PROVIDER_SITE_OTHER): Payer: 59 | Admitting: Obstetrics & Gynecology

## 2022-02-10 VITALS — BP 125/76 | HR 85 | Wt 215.6 lb

## 2022-02-10 DIAGNOSIS — O0990 Supervision of high risk pregnancy, unspecified, unspecified trimester: Secondary | ICD-10-CM

## 2022-02-10 DIAGNOSIS — Z8759 Personal history of other complications of pregnancy, childbirth and the puerperium: Secondary | ICD-10-CM

## 2022-02-10 DIAGNOSIS — Z86718 Personal history of other venous thrombosis and embolism: Secondary | ICD-10-CM

## 2022-02-10 NOTE — Progress Notes (Signed)
   PRENATAL VISIT NOTE  Subjective:  Tina Phillips is a 31 y.o. G1P0 at [redacted]w[redacted]d being seen today for ongoing prenatal care.  She is currently monitored for the following issues for this high-risk pregnancy and has Migraine without aura; Supervision of high risk pregnancy, antepartum; History of maternal deep vein thrombosis (DVT); and Anemia on their problem list.  Patient reports  pressure .  Contractions: Not present. Vag. Bleeding: None.  Movement: Present. Denies leaking of fluid.   The following portions of the patient's history were reviewed and updated as appropriate: allergies, current medications, past family history, past medical history, past social history, past surgical history and problem list.   Objective:   Vitals:   02/10/22 0834  BP: 125/76  Pulse: 85  Weight: 215 lb 9.6 oz (97.8 kg)    Fetal Status: Fetal Heart Rate (bpm): 138 Fundal Height: 26 cm Movement: Present     General:  Alert, oriented and cooperative. Patient is in no acute distress.  Skin: Skin is warm and dry. No rash noted.   Cardiovascular: Normal heart rate noted  Respiratory: Normal respiratory effort, no problems with respiration noted  Abdomen: Soft, gravid, appropriate for gestational age.  Pain/Pressure: Absent     Pelvic: Cervical exam deferred        Extremities: Normal range of motion.  Edema: Trace  Mental Status: Normal mood and affect. Normal behavior. Normal judgment and thought content.   Assessment and Plan:  Pregnancy: G1P0 at [redacted]w[redacted]d 1. Supervision of high risk pregnancy, antepartum F/u anatomy US in 2-3 weeks  2. History of maternal deep vein thrombosis (DVT) On Lovenox  Preterm labor symptoms and general obstetric precautions including but not limited to vaginal bleeding, contractions, leaking of fluid and fetal movement were reviewed in detail with the patient. Please refer to After Visit Summary for other counseling recommendations.   Return in about 3 weeks (around  03/03/2022).  Future Appointments  Date Time Provider Department Center  02/26/2022  8:45 AM [facility] NURSE [facility]  02/26/2022  9:00 AM [facility] US1 [facility]  03/09/2022  8:20 AM [facility] LAB [facility]  03/16/2022  9:15 AM Rachel Moulds, MD [facility] None    Scheryl Darter, MD

## 2022-02-25 ENCOUNTER — Inpatient Hospital Stay (HOSPITAL_COMMUNITY): Payer: 59

## 2022-02-25 ENCOUNTER — Encounter (HOSPITAL_COMMUNITY): Payer: Self-pay | Admitting: Obstetrics & Gynecology

## 2022-02-25 ENCOUNTER — Inpatient Hospital Stay (HOSPITAL_COMMUNITY)
Admission: AD | Admit: 2022-02-25 | Discharge: 2022-02-25 | Disposition: A | Discharge status: Home / Self Care | Payer: 59 | Attending: Obstetrics & Gynecology | Admitting: Obstetrics & Gynecology

## 2022-02-25 ENCOUNTER — Other Ambulatory Visit: Payer: Self-pay

## 2022-02-25 DIAGNOSIS — U071 COVID-19: Secondary | ICD-10-CM

## 2022-02-25 DIAGNOSIS — O98513 Other viral diseases complicating pregnancy, third trimester: Secondary | ICD-10-CM

## 2022-02-25 DIAGNOSIS — O98512 Other viral diseases complicating pregnancy, second trimester: Secondary | ICD-10-CM | POA: Diagnosis not present

## 2022-02-25 DIAGNOSIS — Z3A27 27 weeks gestation of pregnancy: Secondary | ICD-10-CM

## 2022-02-25 DIAGNOSIS — O26892 Other specified pregnancy related conditions, second trimester: Secondary | ICD-10-CM | POA: Insufficient documentation

## 2022-02-25 DIAGNOSIS — R509 Fever, unspecified: Secondary | ICD-10-CM | POA: Insufficient documentation

## 2022-02-25 DIAGNOSIS — Z3689 Encounter for other specified antenatal screening: Secondary | ICD-10-CM

## 2022-02-25 DIAGNOSIS — R519 Headache, unspecified: Secondary | ICD-10-CM | POA: Insufficient documentation

## 2022-02-25 DIAGNOSIS — O0990 Supervision of high risk pregnancy, unspecified, unspecified trimester: Secondary | ICD-10-CM

## 2022-02-25 HISTORY — DX: COVID-19: U07.1

## 2022-02-25 HISTORY — DX: Other viral diseases complicating pregnancy, third trimester: O98.513

## 2022-02-25 LAB — CBC WITH DIFFERENTIAL/PLATELET
Abs Immature Granulocytes: 0.04 10*3/uL (ref 0.00–0.07)
Basophils Absolute: 0 10*3/uL (ref 0.0–0.1)
Basophils Relative: 0 %
Eosinophils Absolute: 0.1 10*3/uL (ref 0.0–0.5)
Eosinophils Relative: 1 %
HCT: 35.3 % — ABNORMAL LOW (ref 36.0–46.0)
Hemoglobin: 11.1 g/dL — ABNORMAL LOW (ref 12.0–15.0)
Immature Granulocytes: 0 %
Lymphocytes Relative: 12 %
Lymphs Abs: 1.2 10*3/uL (ref 0.7–4.0)
MCH: 28.5 pg (ref 26.0–34.0)
MCHC: 31.4 g/dL (ref 30.0–36.0)
MCV: 90.7 fL (ref 80.0–100.0)
Monocytes Absolute: 1.4 10*3/uL — ABNORMAL HIGH (ref 0.1–1.0)
Monocytes Relative: 15 %
Neutro Abs: 6.9 10*3/uL (ref 1.7–7.7)
Neutrophils Relative %: 72 %
Platelets: 252 10*3/uL (ref 150–400)
RBC: 3.89 MIL/uL (ref 3.87–5.11)
RDW: 15.5 % (ref 11.5–15.5)
WBC: 9.6 10*3/uL (ref 4.0–10.5)
nRBC: 0 % (ref 0.0–0.2)

## 2022-02-25 LAB — BASIC METABOLIC PANEL
Anion gap: 11 (ref 5–15)
BUN: <5 mg/dL — ABNORMAL LOW (ref 6–20)
CO2: 21 mmol/L — ABNORMAL LOW (ref 22–32)
Calcium: 9.5 mg/dL (ref 8.9–10.3)
Chloride: 106 mmol/L (ref 98–111)
Creatinine, Ser: 0.72 mg/dL (ref 0.44–1.00)
GFR, Estimated: >60 mL/min (ref >60)
Glucose, Bld: 89 mg/dL (ref 70–99)
Potassium: 3.9 mmol/L (ref 3.5–5.1)
Sodium: 138 mmol/L (ref 135–145)

## 2022-02-25 LAB — URINALYSIS, ROUTINE W REFLEX MICROSCOPIC
Bilirubin Urine: NEGATIVE
Glucose, UA: NEGATIVE mg/dL
Hgb urine dipstick: NEGATIVE
Ketones, ur: 5 mg/dL — AB
Leukocytes,Ua: NEGATIVE
Nitrite: NEGATIVE
Protein, ur: 30 mg/dL — AB
Specific Gravity, Urine: 1.023 (ref 1.005–1.030)
pH: 5 (ref 5.0–8.0)

## 2022-02-25 LAB — SARS CORONAVIRUS 2 BY RT PCR: SARS Coronavirus 2 by RT PCR: POSITIVE — AB

## 2022-02-25 MED ORDER — ACETAMINOPHEN-CAFFEINE 500-65 MG PO TABS
2.0000 | ORAL_TABLET | Freq: Once | ORAL | Status: AC
  Administered 2022-02-25: 2 via ORAL
  Filled 2022-02-25: qty 2

## 2022-02-25 MED ORDER — ACETAMINOPHEN 500 MG PO TABS: 1000.0000 mg | ORAL_TABLET | Freq: Once | ORAL | Status: DC

## 2022-02-25 MED ORDER — GUAIFENESIN ER 600 MG PO TB12: 600.0000 mg | ORAL_TABLET | Freq: Two times a day (BID) | ORAL | 0 refills | Status: AC | PRN

## 2022-02-25 MED ORDER — GUAIFENESIN ER 600 MG PO TB12
600.0000 mg | ORAL_TABLET | Freq: Once | ORAL | Status: AC
  Administered 2022-02-25: 600 mg via ORAL
  Filled 2022-02-25: qty 1

## 2022-02-25 MED ORDER — LACTATED RINGERS IV BOLUS
1000.0000 mL | Freq: Once | INTRAVENOUS | Status: AC
  Administered 2022-02-25: 1000 mL via INTRAVENOUS

## 2022-02-25 MED ORDER — PROMETHAZINE HCL 25 MG PO TABS: 25.0000 mg | ORAL_TABLET | Freq: Four times a day (QID) | ORAL | 0 refills | Status: DC | PRN

## 2022-02-25 NOTE — MAU Note (Signed)
Tina Phillips is a 31 y.o. at [redacted]w[redacted]d here in MAU reporting: body aches, fever (100.6), H/A, and phlegm in chest.  States "it's hard to breathe".Reports has had symptoms for 2 days.  Reports took Tylenol, last taken today @ 0600. Denies LOF or VB.  Endorses +FM. LMP: N/A Onset of complaint: 2 days ago Pain score: 10/10 H/A Vitals:   02/25/22 1143  BP: 98/65  Pulse: (!) 134  Resp: 20  Temp: 99.8 F (37.7 C)  SpO2: 97%     VCB:[id number] Lab orders placed from triage: urine

## 2022-02-25 NOTE — MAU Provider Note (Signed)
History    CSN: [id number]  Arrival date and time: 02/25/22 1126  Event Date/Time  First Provider Initiated Contact with Patient 02/25/22 1200     Chief Complaint  Patient presents with   Body aches   Headache   HPI SHALAYAH BEAGLEY is a 31 y.o. G1P0 at [redacted]w[redacted]d who presents to MAU with chief complaint of body aches, fever and decreased appetite for the past 2 days. Patient's father is also sick with identical symptoms. Patient reports T-Max at home of 100.9. She has been managing her symptoms with Tylenol but has not experienced relief. She completed her COVID vaccine series.  Patient reports SOB. Onset two days ago. Patient denies chest pain, palpitations. She reports feeling fatigues and sleepy r/t her SOB but denies activity intolerance.  Patient also c/o new onset headache. Pain score is 10/10. She denies aggravating or alleviating factors. Pain was not improved with the Tylenol she took for her other symptoms.   Patient receives care with [facility].  OB History     Gravida  1   Para      Term      Preterm      AB      Living         SAB      IAB      Ectopic      Multiple      Live Births              Past Medical History:  Diagnosis Date   Anemia    DVT (deep venous thrombosis) (HCC) 2019   Right posterior tibial vein DVT in 2019 while on oral contraceptives   Ventral hernia     Past Surgical History:  Procedure Laterality Date   INSERTION OF MESH N/A 12/30/2017   Procedure: INSERTION OF MESH;  Surgeon: Berna Bue, MD;  Location: [facility];  Service: General;  Laterality: N/A;   POLYPECTOMY     VENTRAL HERNIA REPAIR N/A 12/30/2017   Procedure: LAPAROSCOPIC VENTRAL HERNIA WITH MESH;  Surgeon: Berna Bue, MD;  Location: [facility] OR;  Service: General;  Laterality: N/A;   WISDOM TOOTH EXTRACTION      Family History  Problem Relation Age of Onset   Diabetes Mother    Deep vein thrombosis Mother    Congestive Heart Failure Mother    Diabetes  Father    Hyperlipidemia Father    Post-traumatic stress disorder Father    Prostate cancer Father    Deep vein thrombosis Father    Cancer - Prostate Father     Social History   Tobacco Use   Smoking status: Never   Smokeless tobacco: Never  Vaping Use   Vaping Use: Never used  Substance Use Topics   Alcohol use: No   Drug use: No    Allergies: No Known Allergies  Medications Prior to Admission  Medication Sig Dispense Refill Last Dose   aspirin EC 81 MG tablet Take 1 tablet (81 mg total) by mouth daily. Take after 12 weeks for prevention of preeclampsia later in pregnancy (Patient not taking: Reported on 01/29/2022) 300 tablet 2    Biotin 56979 MCG TABS Take 10,000 mcg by mouth daily.      enoxaparin (LOVENOX) 40 MG/0.4ML injection Inject 0.4 mLs (40 mg total) into the skin daily. (Patient not taking: Reported on 02/10/2022) 12 mL 1    ferrous sulfate 325 (65 FE) MG EC tablet Take 325 mg by mouth 3 (three) times daily  with meals.      Multiple Vitamin (MULTIVITAMIN WITH MINERALS) TABS tablet Take 1 tablet by mouth daily. (Patient not taking: Reported on 01/11/2022)      Prenatal Vit-Fe Fumarate-FA (PRENATAL 1+1 PO) Prenatal 1       Review of Systems  Constitutional:  Positive for chills, fatigue and fever.  Respiratory:  Positive for shortness of breath.   Gastrointestinal:  Positive for nausea.  Neurological:  Positive for headaches.  All other systems reviewed and are negative.  Physical Exam   Blood pressure 98/65, pulse (!) 134, temperature 99.8 F (37.7 C), temperature source Oral, resp. rate 20, height 5\' 6"  (1.676 m), weight 97.2 kg, last menstrual period 08/19/2021, SpO2 97 %.  Physical Exam Vitals and nursing note reviewed.  Constitutional:      Appearance: She is well-developed. She is ill-appearing.  Cardiovascular:     Rate and Rhythm: Tachycardia present.     Pulses: Normal pulses.     Heart sounds: Normal heart sounds.  Pulmonary:     Effort: Pulmonary  effort is normal.     Breath sounds: Wheezing present.     Comments: Right upper inspiratory wheeze Abdominal:     Palpations: Abdomen is soft.  Skin:    Capillary Refill: Capillary refill takes less than 2 seconds.  Neurological:     Mental Status: She is oriented to person, place, and time. She is lethargic.  Psychiatric:        Mood and Affect: Mood normal.        Speech: Speech normal.        Behavior: Behavior normal.     MAU Course  Procedures  MDM  --Reactive tracing: baseline 140, mod var, + accels, no decels --Toco: quiet --Reviewed treatment options for COVID infection and symptom management. Patient elects rx for Mucinex and Phenergan. Discussed potential for intensification of symptoms Day 4 (tomorrow). Reviewed signs of worsening acuity, indications for return to MAU --Continue Tylenol PRN for management of fever  Orders Placed This Encounter  Procedures   SARS Coronavirus 2 by RT PCR (hospital order, performed in [facility] hospital lab) *cepheid single result test* Anterior Nasal Swab   Culture, OB Urine   DG Chest Port 1 View   Urinalysis, Routine w reflex microscopic Urine, Clean Catch   CBC with Differential/Platelet   Basic metabolic panel   Airborne and Contact precautions   Pulse oximetry, continuous   Insert peripheral IV   Discharge patient   Patient Vitals for the past 24 hrs:  BP Temp Temp src Pulse Resp SpO2 Height Weight  02/25/22 1359 116/65 -- -- 95 18 99 % -- --  02/25/22 1212 106/63 -- -- (!) 117 -- 97 % -- --  02/25/22 1143 98/65 99.8 F (37.7 C) Oral (!) 134 20 97 % -- --  02/25/22 1137 -- -- -- -- -- -- 5\' 6"  (1.676 m) 97.2 kg   Results for orders placed or performed during the hospital encounter of 02/25/22 (from the past 24 hour(s))  Urinalysis, Routine w reflex microscopic Urine, Clean Catch     Status: Abnormal   Collection Time: 02/25/22 11:53 AM  Result Value Ref Range   Color, Urine AMBER (A) YELLOW   APPearance HAZY (A)  CLEAR   Specific Gravity, Urine 1.023 1.005 - 1.030   pH 5.0 5.0 - 8.0   Glucose, UA NEGATIVE NEGATIVE mg/dL   Hgb urine dipstick NEGATIVE NEGATIVE   Bilirubin Urine NEGATIVE NEGATIVE   Ketones, ur 5 (  A) NEGATIVE mg/dL   Protein, ur 30 (A) NEGATIVE mg/dL   Nitrite NEGATIVE NEGATIVE   Leukocytes,Ua NEGATIVE NEGATIVE   RBC / HPF 0-5 0 - 5 RBC/hpf   WBC, UA 0-5 0 - 5 WBC/hpf   Bacteria, UA MANY (A) NONE SEEN   Squamous Epithelial / LPF 0-5 0 - 5   Mucus PRESENT   SARS Coronavirus 2 by RT PCR (hospital order, performed in [facility] hospital lab) *cepheid single result test* Anterior Nasal Swab     Status: Abnormal   Collection Time: 02/25/22 12:13 PM   Specimen: Anterior Nasal Swab  Result Value Ref Range   SARS Coronavirus 2 by RT PCR POSITIVE (A) NEGATIVE  CBC with Differential/Platelet     Status: Abnormal   Collection Time: 02/25/22 12:13 PM  Result Value Ref Range   WBC 9.6 4.0 - 10.5 K/uL   RBC 3.89 3.87 - 5.11 MIL/uL   Hemoglobin 11.1 (L) 12.0 - 15.0 g/dL   HCT 92.4 (L) 26.8 - 34.1 %   MCV 90.7 80.0 - 100.0 fL   MCH 28.5 26.0 - 34.0 pg   MCHC 31.4 30.0 - 36.0 g/dL   RDW 96.2 22.9 - 79.8 %   Platelets 252 150 - 400 K/uL   nRBC 0.0 0.0 - 0.2 %   Neutrophils Relative % 72 %   Neutro Abs 6.9 1.7 - 7.7 K/uL   Lymphocytes Relative 12 %   Lymphs Abs 1.2 0.7 - 4.0 K/uL   Monocytes Relative 15 %   Monocytes Absolute 1.4 (H) 0.1 - 1.0 K/uL   Eosinophils Relative 1 %   Eosinophils Absolute 0.1 0.0 - 0.5 K/uL   Basophils Relative 0 %   Basophils Absolute 0.0 0.0 - 0.1 K/uL   Immature Granulocytes 0 %   Abs Immature Granulocytes 0.04 0.00 - 0.07 K/uL  Basic metabolic panel     Status: Abnormal   Collection Time: 02/25/22 12:13 PM  Result Value Ref Range   Sodium 138 135 - 145 mmol/L   Potassium 3.9 3.5 - 5.1 mmol/L   Chloride 106 98 - 111 mmol/L   CO2 21 (L) 22 - 32 mmol/L   Glucose, Bld 89 70 - 99 mg/dL   BUN <5 (L) 6 - 20 mg/dL   Creatinine, Ser 9.21 0.44 - 1.00 mg/dL    Calcium 9.5 8.9 - 19.4 mg/dL   GFR, Estimated >17 >40 mL/min   Anion gap 11 5 - 15   DG Chest Port 1 View  Result Date: 02/25/2022 CLINICAL DATA:  [id number] cough, fever and chills, wheezing at the right side of the chest EXAM: PORTABLE CHEST 1 VIEW COMPARISON:  February 26, 2020 FINDINGS: The heart size and mediastinal contours are within normal limits. Low lung volumes. There is some peribronchial cuffing seen. No consolidation, pleural effusion or vascular congestion. The visualized skeletal structures are unremarkable. IMPRESSION: There is some peribronchial cuffing seen of likely bronchitis. Otherwise, lungs are clear. Electronically Signed   By: Marjo Bicker M.D.   On: 02/25/2022 13:17     Meds ordered this encounter  Medications   lactated ringers bolus 1,000 mL   DISCONTD: acetaminophen (TYLENOL) tablet 1,000 mg   guaiFENesin (MUCINEX) 12 hr tablet 600 mg    [redacted] weeks pregnant   acetaminophen-caffeine (EXCEDRIN TENSION HEADACHE) 500-65 MG per tablet 2 tablet   promethazine (PHENERGAN) 25 MG tablet    Sig: Take 1 tablet (25 mg total) by mouth every 6 (six) hours as needed  for nausea or vomiting.    Dispense:  30 tablet    Refill:  0    Order Specific Question:   Supervising Provider    Answer:   Jaynie Collins A [[id number]]   guaiFENesin (MUCINEX) 600 MG 12 hr tablet    Sig: Take 1 tablet (600 mg total) by mouth 2 (two) times daily as needed for up to 14 days.    Dispense:  28 tablet    Refill:  0    Order Specific Question:   Supervising Provider    Answer:   Jaynie Collins A [[id number]]   Assessment and Plan  --32 y.o. G1P0 at [redacted]w[redacted]d  --Reactive tracing --COVID POSITIVE --Pulse ox >/= 98% on room air --Headache resolved prior to discharge --Discharge home in stable condition with strict return precautions  Calvert Cantor, MSA, MSN, CNM 02/25/2022, 3:40 PM

## 2022-02-25 NOTE — Discharge Instructions (Signed)

## 2022-02-26 ENCOUNTER — Ambulatory Visit: Payer: No Typology Code available for payment source

## 2022-02-26 LAB — CULTURE, OB URINE: Culture: <10000 — AB

## 2022-03-02 ENCOUNTER — Other Ambulatory Visit: Payer: Self-pay

## 2022-03-02 DIAGNOSIS — O0990 Supervision of high risk pregnancy, unspecified, unspecified trimester: Secondary | ICD-10-CM

## 2022-03-04 ENCOUNTER — Telehealth (INDEPENDENT_AMBULATORY_CARE_PROVIDER_SITE_OTHER): Payer: 59 | Admitting: Obstetrics & Gynecology

## 2022-03-04 DIAGNOSIS — U071 COVID-19: Secondary | ICD-10-CM

## 2022-03-04 DIAGNOSIS — O99513 Diseases of the respiratory system complicating pregnancy, third trimester: Secondary | ICD-10-CM

## 2022-03-04 DIAGNOSIS — Z86718 Personal history of other venous thrombosis and embolism: Secondary | ICD-10-CM

## 2022-03-04 DIAGNOSIS — Z3A28 28 weeks gestation of pregnancy: Secondary | ICD-10-CM

## 2022-03-04 DIAGNOSIS — O0993 Supervision of high risk pregnancy, unspecified, third trimester: Secondary | ICD-10-CM

## 2022-03-04 DIAGNOSIS — O0990 Supervision of high risk pregnancy, unspecified, unspecified trimester: Secondary | ICD-10-CM

## 2022-03-04 NOTE — Progress Notes (Signed)
Pt states does not have a BP Cuff, Pt has private Ins. Asked to purchase one.

## 2022-03-04 NOTE — Progress Notes (Signed)
I connected with Tina Phillips 03/04/22 at  9:55 AM EDT by: MyChart video and verified that I am speaking with the correct person using two identifiers.  Patient is located at home and provider is located at [facility].     I discussed the limitations, risks, security and privacy concerns of performing an evaluation and management service by MyChart video and the availability of in person appointments. I also discussed with the patient that there may be a patient responsible charge related to this service. By engaging in this virtual visit, you consent to the provision of healthcare.  Additionally, you authorize for your insurance to be billed for the services provided during this visit.  The patient expressed understanding and agreed to proceed.  The following staff members participated in the virtual visit:  Tina Phenix, MD Tina Phillips    PRENATAL VISIT NOTE  Subjective:  Tina Phillips is a 31 y.o. G1P0 at [redacted]w[redacted]d  for virtual visit for ongoing prenatal care.  She is currently monitored for the following issues for this high-risk pregnancy and has Migraine without aura; Supervision of high risk pregnancy, antepartum; History of maternal deep vein thrombosis (DVT); Anemia; and COVID-19 virus infection on their problem list.  Patient reports  cough and congestion s/p Covid .  Contractions: Not present. Vag. Bleeding: None.  Movement: Present. Denies leaking of fluid.   The following portions of the patient's history were reviewed and updated as appropriate: allergies, current medications, past family history, past medical history, past social history, past surgical history and problem list.   Objective:  There were no vitals filed for this visit. Self-Obtained  Fetal Status:     Movement: Present     Assessment and Plan:  Pregnancy: G1P0 at [redacted]w[redacted]d 1. Supervision of high risk pregnancy, antepartum F/u next week  2. COVID-19 virus infection Dx last week  3. History of maternal deep vein  thrombosis (DVT) On Lovenox  Preterm labor symptoms and general obstetric precautions including but not limited to vaginal bleeding, contractions, leaking of fluid and fetal movement were reviewed in detail with the patient.  No follow-ups on file.  Future Appointments  Date Time Provider Department Center  03/09/2022  8:20 AM [facility]  03/11/2022  7:15 AM [facility] NURSE [facility]  03/11/2022  7:30 AM [facility] US2 [facility]  03/16/2022  9:15 AM I[place], Burnice Logan, MD [facility] None     Time spent on virtual visit: 11 minutes  Tina Darter, MD Patient ID: [name], female   DOB: 08-25-1990, 31 y.o.   MRN: [id number]

## 2022-03-09 ENCOUNTER — Other Ambulatory Visit: Payer: Self-pay

## 2022-03-09 ENCOUNTER — Other Ambulatory Visit: Payer: 59

## 2022-03-09 ENCOUNTER — Ambulatory Visit: Payer: 59 | Admitting: Hematology and Oncology

## 2022-03-09 DIAGNOSIS — O0990 Supervision of high risk pregnancy, unspecified, unspecified trimester: Secondary | ICD-10-CM

## 2022-03-10 LAB — CBC
Hematocrit: 35.4 % (ref 34.0–46.6)
Hemoglobin: 11.6 g/dL (ref 11.1–15.9)
MCH: 28.7 pg (ref 26.6–33.0)
MCHC: 32.8 g/dL (ref 31.5–35.7)
MCV: 88 fL (ref 79–97)
Platelets: 320 10*3/uL (ref 150–450)
RBC: 4.04 x10E6/uL (ref 3.77–5.28)
RDW: 14.1 % (ref 11.7–15.4)
WBC: 9.3 10*3/uL (ref 3.4–10.8)

## 2022-03-10 LAB — RPR: RPR Ser Ql: NONREACTIVE

## 2022-03-10 LAB — GLUCOSE TOLERANCE, 2 HOURS W/ 1HR
Glucose, 1 hour: 195 mg/dL — ABNORMAL HIGH (ref 70–179)
Glucose, 2 hour: 169 mg/dL — ABNORMAL HIGH (ref 70–152)
Glucose, Fasting: 93 mg/dL — ABNORMAL HIGH (ref 70–91)

## 2022-03-10 LAB — HIV ANTIBODY (ROUTINE TESTING W REFLEX): HIV Screen 4th Generation wRfx: NONREACTIVE

## 2022-03-11 ENCOUNTER — Ambulatory Visit (HOSPITAL_BASED_OUTPATIENT_CLINIC_OR_DEPARTMENT_OTHER): Payer: 59

## 2022-03-11 ENCOUNTER — Ambulatory Visit: Payer: 59 | Attending: Obstetrics and Gynecology | Admitting: *Deleted

## 2022-03-11 ENCOUNTER — Other Ambulatory Visit: Payer: Self-pay | Admitting: *Deleted

## 2022-03-11 VITALS — BP 115/63 | HR 78

## 2022-03-11 DIAGNOSIS — Z362 Encounter for other antenatal screening follow-up: Secondary | ICD-10-CM | POA: Diagnosis not present

## 2022-03-11 DIAGNOSIS — O24419 Gestational diabetes mellitus in pregnancy, unspecified control: Secondary | ICD-10-CM

## 2022-03-11 DIAGNOSIS — O99213 Obesity complicating pregnancy, third trimester: Secondary | ICD-10-CM

## 2022-03-11 DIAGNOSIS — E669 Obesity, unspecified: Secondary | ICD-10-CM

## 2022-03-11 DIAGNOSIS — Z6834 Body mass index (BMI) 34.0-34.9, adult: Secondary | ICD-10-CM

## 2022-03-11 DIAGNOSIS — Z3A29 29 weeks gestation of pregnancy: Secondary | ICD-10-CM

## 2022-03-11 DIAGNOSIS — Z7901 Long term (current) use of anticoagulants: Secondary | ICD-10-CM | POA: Diagnosis not present

## 2022-03-11 DIAGNOSIS — O2233 Deep phlebothrombosis in pregnancy, third trimester: Secondary | ICD-10-CM

## 2022-03-11 DIAGNOSIS — O0990 Supervision of high risk pregnancy, unspecified, unspecified trimester: Secondary | ICD-10-CM

## 2022-03-11 DIAGNOSIS — Z86718 Personal history of other venous thrombosis and embolism: Secondary | ICD-10-CM

## 2022-03-15 ENCOUNTER — Telehealth: Payer: Self-pay

## 2022-03-15 MED ORDER — ACCU-CHEK SOFTCLIX LANCETS MISC: 12 refills | Status: DC

## 2022-03-15 MED ORDER — ACCU-CHEK GUIDE VI STRP: ORAL_STRIP | 12 refills | Status: DC

## 2022-03-15 MED ORDER — ACCU-CHEK GUIDE W/DEVICE KIT: 1.0000 | PACK | 0 refills | Status: DC | PRN

## 2022-03-15 NOTE — Telephone Encounter (Addendum)
Pt reports that she is having a pulling pain in left side groin.   I explain to the pt that it is normal for round ligament pain in pregnancy as the baby grows.  Pt states that she saw that she has GDM.  Results confirmed.  Pt scheduled for diabetes education tomorrow 03/16/22 @ 1115.  Pt testing supplies sent to [facility] on [address].  Pt requested to call the office if she has any concerns.  Pt verbalized understanding.    [name], RN  03/15/22

## 2022-03-16 ENCOUNTER — Encounter: Payer: 59 | Attending: Physician Assistant | Admitting: Registered"

## 2022-03-16 ENCOUNTER — Other Ambulatory Visit: Payer: Self-pay | Admitting: General Practice

## 2022-03-16 ENCOUNTER — Other Ambulatory Visit: Payer: Self-pay

## 2022-03-16 ENCOUNTER — Inpatient Hospital Stay: Payer: 59 | Attending: Hematology and Oncology | Admitting: Hematology and Oncology

## 2022-03-16 ENCOUNTER — Ambulatory Visit (INDEPENDENT_AMBULATORY_CARE_PROVIDER_SITE_OTHER): Payer: 59 | Admitting: Registered"

## 2022-03-16 DIAGNOSIS — O24419 Gestational diabetes mellitus in pregnancy, unspecified control: Secondary | ICD-10-CM | POA: Insufficient documentation

## 2022-03-16 DIAGNOSIS — O24410 Gestational diabetes mellitus in pregnancy, diet controlled: Secondary | ICD-10-CM

## 2022-03-16 HISTORY — DX: Gestational diabetes mellitus in pregnancy, unspecified control: O24.419

## 2022-03-16 NOTE — Progress Notes (Deleted)
[facility] CONSULT NOTE  Patient Care Team: Trey Sailors, [place] as PCP - General (Physician Assistant) Osborne Oman, MD as PCP - OBGYN (Obstetrics and Gynecology)  CHIEF COMPLAINTS/PURPOSE OF CONSULTATION:   ASSESSMENT & PLAN:   This is a very pleasant 31 year old female patient with right posterior tibial vein DVT in 2019 while on oral contraceptives now [redacted] weeks pregnant referred to hematology for anticoagulation recommendations.  She does admit to a family history of DVT in both mom and dad.  Since she had provoked DVT secondary to estrogen-based contraception, I do believe she will benefit from at least prophylactic dose of anticoagulation during pregnancy.  I have clearly explained to her that women are at high risk for DVT/PE during pregnancy and immediate postpartum and she should consider Lovenox prophylaxis during pregnancy and immediate postpartum for up to 6 weeks.    With regards to her iron deficiency anemia, recommended oral iron supplementation at least once a day ferrous sulfate every day in addition to prenatal vitamins.  Since her hemoglobin is still at around 10 g, I do not see any immediate need for intravenous iron.  She is here for follow-up.  Most recent labs from July reviewed with a hemoglobin of 11.6.  Return to clinic in about 6 months.  HISTORY OF PRESENTING ILLNESS:  MARIAMA Phillips 31 y.o. female is here because of history of DVT   This is a very pleasant 31 year old female patient currently at 22 weeks of gestation with past medical history significant for right lower extremity posterior tibial vein DVT back in 2019 when she took anticoagulation with Xarelto for about 3 to 6 months who is referred to hematology for recommendations regarding anticoagulation in pregnancy.  During her initial visit we have recommended anticoagulation prophylaxis with Lovenox 40 mg daily for pregnancy and immediate postpartum which is about 6 weeks after  delivery   REVIEW OF SYSTEMS:   Constitutional: Denies fevers, chills or abnormal night sweats Eyes: Denies blurriness of vision, double vision or watery eyes Ears, nose, mouth, throat, and face: Denies mucositis or sore throat Respiratory: Denies cough, dyspnea or wheezes Cardiovascular: Denies palpitation, chest discomfort or lower extremity swelling Gastrointestinal:  Denies nausea, heartburn or change in bowel habits Skin: Denies abnormal skin rashes Lymphatics: Denies new lymphadenopathy or easy bruising Neurological:Denies numbness, tingling or new weaknesses Behavioral/Psych: Mood is stable, no new changes  All other systems were reviewed with the patient and are negative.  MEDICAL HISTORY:  Past Medical History:  Diagnosis Date   Anemia    DVT (deep venous thrombosis) ([place]) 2019   Right posterior tibial vein DVT in 2019 while on oral contraceptives   Ventral hernia     SURGICAL HISTORY: Past Surgical History:  Procedure Laterality Date   INSERTION OF MESH N/A 12/30/2017   Procedure: INSERTION OF MESH;  Surgeon: Clovis Riley, MD;  Location: [facility];  Service: General;  Laterality: N/A;   POLYPECTOMY     VENTRAL HERNIA REPAIR N/A 12/30/2017   Procedure: LAPAROSCOPIC VENTRAL HERNIA WITH MESH;  Surgeon: Clovis Riley, MD;  Location: [facility];  Service: General;  Laterality: N/A;   WISDOM TOOTH EXTRACTION      SOCIAL HISTORY: Social History   Socioeconomic History   Marital status: Single    Spouse name: Not on file   Number of children: Not on file   Years of education: Not on file   Highest education level: Not on file  Occupational History  Occupation: Academic librarian: [employer]  Tobacco Use   Smoking status: Never   Smokeless tobacco: Never  Vaping Use   Vaping Use: Never used  Substance and Sexual Activity   Alcohol use: No   Drug use: No   Sexual activity: Yes    Partners: Male    Birth control/protection: Pill  Other Topics Concern   Not on  file  Social History Narrative   Lives with mom, dad, brother and sister   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 - wants to transfer to [employer] - wants to be an event planner   Works at [employer] in [employer]inants of Radio broadcast assistant Strain: Not on Art therapist Insecurity: Not on file  Transportation Needs: Not on file  Physical Activity: Not on file  Stress: Not on file  Social Connections: Not on file  Intimate Partner Violence: Not on file    FAMILY HISTORY: Family History  Problem Relation Age of Onset   Diabetes Mother    Deep vein thrombosis Mother    Congestive Heart Failure Mother    Diabetes Father    Hyperlipidemia Father    Post-traumatic stress disorder Father    Prostate cancer Father    Deep vein thrombosis Father    Cancer - Prostate Father     ALLERGIES:  has No Known Allergies.  MEDICATIONS:  Current Outpatient Medications  Medication Sig Dispense Refill   Accu-Chek Softclix Lancets lancets Use as instructed 100 each 12   aspirin EC 81 MG tablet Take 1 tablet (81 mg total) by mouth daily. Take after 12 weeks for prevention of preeclampsia later in pregnancy (Patient not taking: Reported on 01/29/2022) 300 tablet 2   Biotin 10000 MCG TABS Take 10,000 mcg by mouth daily. (Patient not taking: Reported on 03/11/2022)     Blood Glucose Monitoring Suppl (ACCU-CHEK GUIDE) w/Device KIT 1 Device by Does not apply route as needed. 1 kit 0   enoxaparin (LOVENOX) 40 MG/0.4ML injection Inject 0.4 mLs (40 mg total) into the skin daily. 12 mL 1   ferrous sulfate 325 (65 FE) MG EC tablet Take 325 mg by mouth 3 (three) times daily with meals.     glucose blood (ACCU-CHEK GUIDE) test strip Use as instructed 100 each 12   Multiple Vitamin (MULTIVITAMIN WITH MINERALS) TABS tablet Take 1 tablet by mouth daily. (Patient not taking: Reported on 01/11/2022)     Prenatal Vit-Fe Fumarate-FA (PRENATAL 1+1 PO) Prenatal 1     promethazine (PHENERGAN) 25 MG tablet Take 1 tablet (25  mg total) by mouth every 6 (six) hours as needed for nausea or vomiting. 30 tablet 0   No current facility-administered medications for this visit.     PHYSICAL EXAMINATION: ECOG PERFORMANCE STATUS: 0 - Asymptomatic  There were no vitals filed for this visit.  There were no vitals filed for this visit.   GENERAL:alert, no distress and comfortable SKIN: skin color, texture, turgor are normal, no rashes or significant lesions EYES: normal, conjunctiva are pink and non-injected, sclera clear OROPHARYNX:no exudate, no erythema and lips, buccal mucosa, and tongue normal  NECK: supple, thyroid normal size, non-tender, without nodularity LYMPH:  no palpable lymphadenopathy in the cervical, axillary LUNGS: clear to auscultation and percussion with normal breathing effort HEART: regular rate & rhythm and no murmurs and no lower extremity edema ABDOMEN: Gravid uterus.  No hepatosplenomegaly Musculoskeletal:no cyanosis of digits and no clubbing  PSYCH: alert & oriented x 3  with fluent speech NEURO: no focal motor/sensory deficits  LABORATORY DATA:  I have reviewed the data as listed Lab Results  Component Value Date   WBC 9.3 03/09/2022   HGB 11.6 03/09/2022   HCT 35.4 03/09/2022   MCV 88 03/09/2022   PLT 320 03/09/2022     Chemistry      Component Value Date/Time   NA 138 02/25/2022 1213   NA 138 01/11/2022 1455   K 3.9 02/25/2022 1213   CL 106 02/25/2022 1213   CO2 21 (L) 02/25/2022 1213   BUN <5 (L) 02/25/2022 1213   BUN 7 01/11/2022 1455   CREATININE 0.72 02/25/2022 1213      Component Value Date/Time   CALCIUM 9.5 02/25/2022 1213   ALKPHOS 57 01/11/2022 1455   AST 19 01/11/2022 1455   ALT 28 01/11/2022 1455   BILITOT <0.2 01/11/2022 1455     April 2021 no evidence of DVT on vascular evaluation of lower extremity Duplex August 03, 2018 showed acute DVT in the right posterior tibial vein CTPA negative  RADIOGRAPHIC STUDIES: I have personally reviewed the  radiological images as listed and agreed with the findings in the report. [place] MFM OB FOLLOW UP  Result Date: 03/11/2022 ----------------------------------------------------------------------  OBSTETRICS REPORT                       (Signed Final 03/11/2022 09:41 am) ---------------------------------------------------------------------- Patient Info  ID #:       [id number]                          D.O.B.:  21-Jan-1991 (30 yrs)  Name:       Tina Phillips                 Visit Date: 03/11/2022 07:18 am ---------------------------------------------------------------------- Performed By  Attending:        Tama High MD        Ref. Address:     [place], [place]  Performed By:     Rolm Bookbinder RDMS     Location:         [facility]                                                             [place] [facility]                                                             [facility]  Referred By:      Osborne Oman MD ---------------------------------------------------------------------- Orders  #  Description                           Code        Ordered By  1  [place] MFM OB FOLLOW UP                   89211.94    Tama High ----------------------------------------------------------------------  #  Order #                     Accession #                Episode #  1  [id number]                   [id number]                 [id number] ---------------------------------------------------------------------- Indications  Gestational diabetes in pregnancy,             O24.419  unspecified control  Antenatal follow-up for nonvisualized fetal    Z36.2  anatomy  Obesity complicating pregnancy, third          O99.213  trimester  Deep vein  thrombosis (DVT) HX                  O22.30  [redacted] weeks gestation of pregnancy                Z3A.29 ---------------------------------------------------------------------- Fetal Evaluation  Num Of Fetuses:         1  Fetal Heart Rate(bpm):  136  Cardiac Activity:       Observed  Presentation:           Cephalic  Placenta:               Anterior  P. Cord Insertion:      Visualized  Amniotic Fluid  AFI FV:      Within normal limits  AFI Sum(cm)     %Tile       Largest Pocket(cm)  17.26           64          7.45  RUQ(cm)       RLQ(cm)       LUQ(cm)        LLQ(cm)  7.45          3.17          1.29           5.35 ---------------------------------------------------------------------- Biometry  BPD:     73.24  mm     G. Age:  29w 3d         38  %    CI:         74.2   %    70 - 86                                                          FL/HC:      21.2   %    19.6 - 20.8  HC:    269.95   mm     G. Age:  29w 3d         25  %    HC/AC:      1.06        0.99 - 1.21  AC:    255.64   mm     G. Age:  29w 5d         62  %    FL/BPD:     78.0   %    71 - 87  FL:      57.15  mm     G. Age:  30w 0d         13  %    FL/AC:      22.4   %    20 - 24  IOD:      15.2  mm     G. Age:  22w 5d         21  %  OOD:      47.6  mm     G. Age:  28w 1d         62  %  Est. FW:    1449  gm      3 lb 3 oz     60  % ---------------------------------------------------------------------- Gestational Age  LMP:           29w 1d        Date:  08/19/21                 EDD:   05/26/22  U/S Today:     29w 5d                                        EDD:   05/22/22  Best:          29w 1d     Det. By:  LMP  (08/19/21)          EDD:   05/26/22 ---------------------------------------------------------------------- Anatomy  Cranium:               Appears normal         Aortic Arch:            Appears normal  Cavum:                 Appears normal         Ductal Arch:            Appears normal  Ventricles:            Appears normal         Diaphragm:  Appears normal  Choroid Plexus:        Appears normal         Stomach:                Appears normal, left                                                                        sided  Cerebellum:            Appears normal         Abdomen:                Appears normal  Posterior Fossa:       Appears normal         Abdominal Wall:         Appears nml (cord                                                                        insert, abd wall)  Nuchal Fold:           Not applicable (>79    Cord Vessels:           Appears normal ([redacted]                         wks GA)                                        vessel cord)  Face:                  Appears normal         Kidneys:                Appear normal                         (orbits and profile)  Lips:                  Appears normal         Bladder:                Appears normal  Thoracic:              Appears normal         Spine:                  Previously seen  Heart:                 Appears normal         Upper Extremities:      Previously seen                         (4CH, axis, and  situs)  RVOT:                  Appears normal         Lower Extremities:      Previously seen  LVOT:                  Appears normal  Other:  Female fetus. Nasal bone, lenses, maxilla, mandible and falx          visualized VC, 3VV and 3VTV visualized. Heels/feet and open          hands/5th digits prior visualized. ---------------------------------------------------------------------- Impression  Patient return for fetal growth assessment.  She has history  of DVT and is on heparin anticoagulation prophylaxis.  She has a new diagnosis of gestational diabetes.  She is yet  to start checking her blood glucose.  Fetal growth is appropriate for gestational age.  Amniotic fluid  is normal and good fetal activity seen.  I discussed gestational diabetes and the importance of good  blood glucose control to prevent adverse fetal or neonatal  outcomes.  ---------------------------------------------------------------------- Recommendations  -An appointment was made for her to return in 4 weeks for  fetal growth assessment.  -If patient starts insulin or metformin, we will initiate weekly  BPP from her next visit till delivery. ----------------------------------------------------------------------                 Tama High, MD Electronically Signed Final Report   03/11/2022 09:41 am ----------------------------------------------------------------------  DG Chest Port 1 View  Result Date: 02/25/2022 CLINICAL DATA:  [id number] cough, fever and chills, wheezing at the right side of the chest EXAM: PORTABLE CHEST 1 VIEW COMPARISON:  February 26, 2020 FINDINGS: The heart size and mediastinal contours are within normal limits. Low lung volumes. There is some peribronchial cuffing seen. No consolidation, pleural effusion or vascular congestion. The visualized skeletal structures are unremarkable. IMPRESSION: There is some peribronchial cuffing seen of likely bronchitis. Otherwise, lungs are clear. Electronically Signed   By: Frazier Richards M.D.   On: 02/25/2022 13:17    All questions were answered. The patient knows to call the clinic with any problems, questions or concerns. I spent ** minutes in the care of this patient including H and P, review of records, counseling and coordination of care.     Benay Pike, MD 03/16/2022 8:33 AM

## 2022-03-16 NOTE — Progress Notes (Signed)
Patient was seen for Gestational Diabetes self-management on 03/16/22  Start time 1118 and End time 1217   Estimated due date: 05/26/22; [redacted]w[redacted]d  Clinical: Medications: reviewed Medical History: anemia Labs: OGTT 60-630-160, A1c 5.6%   Dietary and Lifestyle History: Pt states Mother has T2D and sister had GDM with last pregnancy. Pt states she has had a sweet tooth with pregnancies. Pt states she doesn't have consistent eating pattern, sometimes doesn't have an appetite and may only eat 1 meal in a day.  Physical Activity: not assessed Stress: not assessed Sleep: not assessed  24 hr Recall:  not assessed   NUTRITION INTERVENTION  Nutrition education (E-1) on the following topics:   Initial Follow-up  [x]  []  Definition of Gestational Diabetes [x]  []  Why dietary management is important in controlling blood glucose [x]  []  Effects each nutrient has on blood glucose levels [x]  []  Simple carbohydrates vs complex carbohydrates [x]  []  Fluid intake [x]  []  Creating a balanced meal plan [x]  []  Carbohydrate counting  [x]  []  When to check blood glucose levels [x]  []  Proper blood glucose monitoring techniques [x]  []  Effect of stress and stress reduction techniques  [x]  []  Exercise effect on blood glucose levels, appropriate exercise during pregnancy [x]  []  Importance of limiting caffeine and abstaining from alcohol and smoking [x]  []  Medications used for blood sugar control during pregnancy [x]  []  Hypoglycemia and rule of 15 [x]  []  Postpartum self care   Patient already has a meter, and has tested blood sugar 1x prior to visit. Random CBG: 120 mg/dL  Patient instructed to monitor glucose levels: FBS: 60 - ? 95 mg/dL (some clinics use 90 for cutoff) 1 hour: ? 140 mg/dL 2 hour: ? mg/dL  Patient received handouts: Nutrition Diabetes and Pregnancy Carbohydrate Counting List  Patient will be seen for follow-up as needed.

## 2022-03-18 ENCOUNTER — Encounter: Payer: Self-pay | Admitting: Family Medicine

## 2022-03-18 ENCOUNTER — Telehealth (INDEPENDENT_AMBULATORY_CARE_PROVIDER_SITE_OTHER): Payer: 59 | Admitting: Family Medicine

## 2022-03-18 DIAGNOSIS — Z86718 Personal history of other venous thrombosis and embolism: Secondary | ICD-10-CM

## 2022-03-18 DIAGNOSIS — Z8759 Personal history of other complications of pregnancy, childbirth and the puerperium: Secondary | ICD-10-CM

## 2022-03-18 DIAGNOSIS — O0993 Supervision of high risk pregnancy, unspecified, third trimester: Secondary | ICD-10-CM

## 2022-03-18 DIAGNOSIS — Z3A30 30 weeks gestation of pregnancy: Secondary | ICD-10-CM

## 2022-03-18 DIAGNOSIS — O24410 Gestational diabetes mellitus in pregnancy, diet controlled: Secondary | ICD-10-CM

## 2022-03-18 DIAGNOSIS — O0990 Supervision of high risk pregnancy, unspecified, unspecified trimester: Secondary | ICD-10-CM

## 2022-03-18 NOTE — Patient Instructions (Signed)
AREA PEDIATRIC/FAMILY PRACTICE PHYSICIANS  Central/Southeast [place] ([address]) [facility] Chambliss, MD; Eniola, MD; Hale, MD; Hensel, MD; McDiarmid, MD; McIntyer, MD; Neal, MD; Walden, MD [address] [place], [place] [address] [phone] Mon-Fri 8:30-12:30, 1:30-5:00 Providers come to see babies at [facility] Accepting Medicaid [facility] providers who accept newborns: Koirala, MD; Morrow, MD; Wolters, MD [address], [place], [place] [address] [phone] Mon-Fri 8:00-5:30 Babies seen by providers at [facility] Does NOT accept Medicaid Please call early in hospitalization for appointment (limited availability)  [facility] Mulberry, MD [address] [place], [place] [address] [phone] Mon, Tue, Thur, Fri 8:30-5:00, Wed 10:00-7:00 (closed 1-2pm) Babies seen by [facility] providers Accepting Medicaid Rubin - Pediatrician Rubin, MD [address], [place], [place] [address] [phone] Mon-Fri 8:30-5:00, Sat 8:30-12:00 Provider comes to see babies at [facility] Accepting Medicaid Must have been referred from current patients or contacted office prior to delivery [facility] ([facility]) Brown, MD; Chandler, MD; Ettefagh, MD; Grant, MD; Lester, MD; McCormick, MD; McQueen, MD; Prose, MD; Simha, MD; Stanley, MD; Stryffeler, NP; Tebben, NP [address], [place], [place] [address] [phone] Mon, Tue, Thur, Fri 8:30-5:30, Wed 9:30-5:30, Sat 8:30-12:30 Babies seen by [facility] providers Accepting Medicaid Only accepting infants of first-time parents or siblings of current patients Hospital discharge coordinator will make follow-up appointment Jack Amos [address], [place], [place]  [address] [phone]   Fax - [phone] [facility] [address], [place], [place]  [address] Phone - [phone]   Fax - [phone] Shilpa Gosrani [address], [place], [place]  [address] [phone]  East/[place]ortheast [place] ([address]) [facility] Bates, MD; [place], MD; Cooper, Cox, MD; MD; Davis, MD; Dovico, MD; Ettefaugh, MD; Little, MD; Lowe, MD; Keiffer, MD; Melvin, MD; Sumner, MD; Williams, MD [address], [place], [place] [address] [phone] Mon-Fri 8:30-5:00 (extended evenings Mon-Thur as needed), Sat-Sun 10:00-1:00 Providers come to see babies at [facility] Accepting Medicaid for families of first-time babies and families with all children in the household age 3 and under. Must register with office prior to making appointment (M-F only). [facility] Henson, NP; Knapp, MD; Lalonde, MD; Tysinger, PA [address] [place], [place] [address] [phone] Mon-Fri 8:00-5:00 Babies seen by providers at [facility] Does NOT accept Medicaid/Commercial Insurance Only [facility] ([facility])  Artis, MD; Barnes, MD; Bratton, MD; Coccaro, MD; Lockett Gardner, MD; Kramer, MD; Marshall, MD; Netherton, MD; Poleto, MD; Skinner, MD [address] [place], [place] [address] [phone] Mon-Fri 8:30-5:30, Sat (Oct.-Mar.) 9:00-1:00 Babies seen by providers at [facility] Accepting Medicaid  [place] ([address]) [facility] Reid, MD; Warner, MD [address], [place], [place] [address] [phone] Mon-Fri 8:30-5:00, Sat 8:30-12:00 Providers come to see babies at [facility] Does NOT accept Medicaid [facility] Becker, PA; Hagler, MD; Scifres, PA; Sun, MD; Swayne, MD [address], [place], [place] [address] [phone] Mon-Fri 8:00-5:00 Babies seen by providers at [facility] Does NOT accept Medicaid Only accepting babies of parents who  are patients Please call early in hospitalization for appointment (limited availability) [facility] Clark, MD; Frye, MD; Kelleher, MD; Mack, NP; Miller, MD; O'Keller, MD; Patterson, NP; Pudlo, MD; Puzio, MD; Thomas, MD; Tucker, MD; Twiselton, MD [address], [place], [place] [address] [phone] Mon-Fri 8:00-5:00, Sat 9:00-12:00 Providers come to see babies at [facility] Does NOT accept Medicaid  [place] ([address]) [facility] providers accepting new patients: Brake, NP; Wharton, PA [address], [place], [place] [address] [phone] Mon-Fri 8:00-5:00 Babies seen by providers at [facility] Does NOT accept Medicaid Only accepting babies of parents who are patients Please call early in hospitalization for appointment (limited availability) [facility] Gay, MD; Quinlan, MD [address] [place], [place] [address] [phone] (press 1 to schedule appointment) Mon-Fri 8:00-5:00 Providers come to see babies at [facility] Does NOT accept Medicaid [facility] Mazer, MD [address] [place], [place] [address] [phone] Mon-Fri 8:30-5:00 (lunch 12:30-1:00), extended hours by appointment only Wed 5:00-6:30 Babies seen by [facility] providers Accepting Medicaid [facility] Banks, MD; Jordan, MD; Koberlein, MD [address], [place], [place] [address] [phone] Mon-Fri 8:00-5:00 Babies seen by [facility] providers Does NOT accept Medicaid [facility] Parker, MD; Hunter, MD; Wallace, DO [address] [place], [place] [address] [phone] Mon-Fri 8:00-5:00 Babies seen by [facility] providers Does NOT accept Medicaid [facility] Brandon, PA; Brecken, PA; Christy, NP; Dees, MD; DeClaire, MD; DeWeese, MD; Hansen, NP; Mills, NP; Parrish, NP; Smoot, NP; Summer, MD; Vapne,  MD [address] [place], [place] [address] [phone] Mon-Fri 8:30-5:00, Sat 10:00-1:00 Providers come to see babies at [facility] Does NOT accept Medicaid Free prenatal information session Tuesdays at 4:45pm [facility] Bouska, MD; Gordon, PA; Jeffery, PA; Weber, PA [address] [place] [address] [phone] Mon-Fri 7:30-5:30 Babies seen by [facility] providers [facility] [address], [place], [place]  [address] [phone]   Fax - [phone]  [place] ([address] & [address]) [facility] [place] [address] [place], [place] [address] [phone] Mon-Thur 8:00-6:00 Providers come to see babies at [facility] Accepting Medicaid [facility] Anderson, NP; Badger, MD; Beal, PA; Spencer, PA [address] [place], [place] [address] [phone] Mon-Thur 7:30-7:30, Fri 7:30-4:30 Babies seen by [facility] providers Accepting Medicaid [facility] Agbuya, MD; Klett, NP; Romgoolam, MD [address], [place], [place] [address] [phone] Mon-Fri 8:30-5:00, Sat 8:30-12:00 Providers come to see babies at [facility] Accepting Medicaid Must have "Meet & Greet" appointment at office prior to delivery [facility] McCord, MD; Wallace, MD; Wood, MD [address], [place], [place] [address] [phone] Mon-Wed 8:00-6:00, Thur-Fri 8:00-5:00, Sat 9:00-12:00 Providers come to see babies at [facility] Does NOT accept Medicaid Only accepting siblings of current patients [facility]  [address], [place], [place]  [address] [phone]   Fax - [phone] [facility] [address], [place], [place]  [address] [phone]   Fax -  [phone]  [place] ([address] & [address]) [facility] Cirigliano, DO; Matthews, DO [address] [place], [place] [address] [phone] Mon-Fri 7:00-5:00 Babies seen by [facility] providers Does NOT accept Medicaid [facility] Briscoe, MD; Howley, PA; Moreira, PA [address], [place],  [address] [phone] Mon-Fri 8:00-5:00 Babies seen by [facility] providers Accepting Medicaid [facility] Boyd, MD; Church, PA; Jones, NP; Osborn, PA [address], ,  [address] [phone] Mon-Fri 8:00-5:00 Babies seen by providers at [facility] Accepting Medicaid  [place] ([address]) [facility] [place] Wendling, DO [address] [place], [place] [address] [phone] Mon-Fri 8:00-5:00 Babies seen by [facility] providers Does NOT accept Medicaid Limited availability, please call early in hospitalization to schedule follow-up [facility] Calderon, PA; Cummings, MD; Dillard, MD; Martin, PA; Olson, MD; VanDeven, PA [address], [place], [place] [address] [phone] Mon-Fri 8:30-5:00, Sat 9:00-12:00 Babies seen by providers at [facility] Accepting Medicaid Please register online then schedule online or call office [email or web address] Hunter, NP; Kumar, MD; Martin Rogers, PA [address], [place], [place] [address] [phone] Mon-Fri 8:00-5:00 Babies seen by providers at [facility] Accepting Medicaid [facility] ([facility]) Kalaeloa, MD; Kristi Fleenor, NP; West, MD [address], [place], [place] [address] [phone] Mon-Fri 8:00-5:30, Sat&Sun by appointment (phones open at  8:30) Babies seen by [facility] providers Accepting Medicaid Must be a first-time baby or sibling of current patient [facility] [place]  [address], [place], [place]  [address] [phone]   Fax - [phone]  [place] ([address]) [facility] Brown, PA; Cowen, PA; Rice, MD; Helton, PA; Spry, MD [address] [place], [place] [address] [phone] Mon-Thur 8:00-7:00, Fri 8:00-5:00, Sat 8:00-12:00, Sun 9:00-12:00 Babies seen by [facility] providers Accepting Medicaid [facility] Coe-Goins, MD; Marshall, MD; Pierre-Louis, MD [address], [place], [place] [address] [phone] Mon-Thur 8:00-5:00 Babies seen by providers at [facility] Accepting Medicaid [facility] Bratton, MD; Coe-Goins, MD; Hayes, MD; Lewis, MD; List, MD; Lott, MD; Marshall, MD; Moran, MD; O'Neal, MD; Pierre-Louis, MD; Pitonzo, MD; Scholer, MD; Spangle, MD [address] [place], [place] [address] [phone] Mon-Fri 8:00-5:30, Sat (Oct.-Mar.) 9:00-1:00 Babies seen by providers at [facility] Accepting Medicaid Must fill out new patient packet, available online at [email or web address] [facility] ([facility]) Friddle, NP; Harris, NP; Kelly, NP; Logan, MD; Melvin, PA; Poth, MD; Ramadoss, MD; Stanton, NP [address], [place], [place] [address] [phone] Mon-Thur 8:00-5:30, Fri 8:00-5:00 Babies seen by providers at [facility] Accepting Medicaid  [place] ([address]) [facility] Dixon, PA; Brazos, MD; Pickard, MD; Tapia, PA [address], [place], [place] [address] [phone] Mon-Fri 8:00-5:00 Babies seen by providers at [facility] Accepting Medicaid   [place] ([address]) [facility] [place] Masneri, DO; Meyers, MD; Nelson, PA [address], [place], [place] [address] [phone] Mon-Fri 8:00-5:00 Babies seen by providers at [facility] Does NOT accept Medicaid Limited appointment availability, please call early in hospitalization  [facility] [place] Kunedd, DO; McGowen, MD [address], [place], [place] [address] [phone] Mon-Fri 8:00-5:00 Babies seen by [facility] providers Does NOT accept Medicaid [facility] [place] Cameron, MD; MacDonald, MD; Michaels, PA; Nayak, MD [address] [place] [address], [place], [place] [address] [phone] Mon-Fri 8:00-5:00 After hours clinic ([address] [place], [place] [address]) [phone] Mon-Fri 5:00-8:00, Sat 12:00-6:00, Sun 10:00-4:00 Babies seen by [facility] providers Accepting Medicaid [facility] [place] [address], [place], [place]  [address] [phone]   Fax - [phone]  [place] ([address]) [facility] [place] [facility] Andy, MD [address], [place], [place] [address] [phone] Mon-Fri 8:00-5:00 Babies seen by [facility] providers Does NOT accept Medicaid [facility] [place] ([facility] [place]) Eksir, MD [address], [place], [place] [address] [phone] Mon-Thur 8:00-7:00, Fri 8:00-5:00, Sat 8:00-12:00 Babies seen by providers at [facility] Accepting Medicaid - but does not have vaccinations in office (must be received elsewhere) Limited availability, please call early in hospitalization  [place] ([address]) [facility]  Charlene Flemming, MD [address], [place] [address] [phone]  Fax [phone]  [facility]  Bintou Lafata, MD, Annamarie Streilein, PA, Carla Hampton, PA [address] [place], [place]  [address] [phone] [facility]  [address], [place], [place] [address] [phone] [address], [place], [place] [address] [phone] (West Office)  [facility] [address], [place], [place] [address] [phone] [facility] [address], [place], [place] [address] [phone] [facility] [address], [place], [place] [address] [phone] [facility] [address], [place], [place] [address] [phone] [facility] [address], [place], [place] [address] [phone] [facility] [address], [place], [place] [address] [phone] [facility]  [address], [place], [place] [address] [phone] Dr. [facility] Little [address] [place] [address], [place],  [address] [phone] [facility] [address], [place],  [address] [phone] [facility] [address], [place],  [address] [phone]  

## 2022-03-18 NOTE — Progress Notes (Signed)
OBSTETRICS PRENATAL VIRTUAL VISIT ENCOUNTER NOTE  Provider location: [facility]   Patient location: Home  I connected with Tina Phillips on 03/18/22 at 10:35 AM EDT by MyChart Video Encounter and verified that I am speaking with the correct person using two identifiers. I discussed the limitations, risks, security and privacy concerns of performing an evaluation and management service virtually and the availability of in person appointments. I also discussed with the patient that there may be a patient responsible charge related to this service. The patient expressed understanding and agreed to proceed.  Subjective:  MAPLE ODANIEL is a 31 y.o. G1P0 at 24w1dbeing seen today for ongoing prenatal care.  She is currently monitored for the following issues for this high-risk pregnancy and has Migraine without aura; Supervision of high risk pregnancy, antepartum; History of maternal deep vein thrombosis (DVT); Anemia; COVID-19 virus infection; and Gestational diabetes mellitus (GDM), antepartum on their problem list.  Patient reports no complaints.  Contractions: Not present. Vag. Bleeding: None.  Movement: Present. Denies any leaking of fluid.   The following portions of the patient's history were reviewed and updated as appropriate: allergies, current medications, past family history, past medical history, past social history, past surgical history and problem list.   Objective:  There were no vitals filed for this visit.  BP NOT CHECKED  Fetal Status:     Movement: Present      General:  Alert, oriented and cooperative. Patient is in no acute distress.  Respiratory: Normal respiratory effort, no problems with respiration noted  Mental Status: Normal mood and affect. Normal behavior. Normal judgment and thought content.  Rest of physical exam deferred due to type of encounter  Imaging: [place] OB FOLLOW [place]  Result Date:  03/11/2022 ----------------------------------------------------------------------  OBSTETRICS REPORT                       (Signed Final 03/11/2022 09:41 am) ---------------------------------------------------------------------- Patient Info  ID #:       [id number]                         D.O.B.:  1992(31 yrs)  Name:       Tina Phillips                 Visit Date: 03/11/2022 07:18 am ---------------------------------------------------------------------- Performed By  Attending:        RTama HighMD        Ref. Address:     [address]  [place]  Performed By:     Rolm Bookbinder RDMS     [facility]                                                             [place] [facility]  Referred By:      Osborne Oman MD ---------------------------------------------------------------------- Orders  #  Description                           Code        Ordered By  1  [place] MFM OB FOLLOW UP                   09381.82    Tama High ----------------------------------------------------------------------  #  Order #                     Accession #                Episode #  1  [id number]                   [id number]                 [id number] ---------------------------------------------------------------------- Indications  Gestational diabetes in pregnancy,             O24.419  unspecified control  Antenatal follow-up for nonvisualized fetal    Z36.2  anatomy  Obesity complicating pregnancy, third          O99.213  trimester  Deep vein thrombosis (DVT) HX                  O22.30  [redacted] weeks gestation of pregnancy                Z3A.29  ---------------------------------------------------------------------- Fetal Evaluation  Num Of Fetuses:         1  Fetal Heart Rate(bpm):  136  Cardiac Activity:       Observed  Presentation:           Cephalic  Placenta:               Anterior  P. Cord Insertion:      Visualized  Amniotic Fluid  AFI FV:      Within normal limits  AFI Sum(cm)     %Tile  Largest Pocket(cm)  17.26           64          7.45  RUQ(cm)       RLQ(cm)       LUQ(cm)        LLQ(cm)  7.45          3.17          1.29           5.35 ---------------------------------------------------------------------- Biometry  BPD:     73.24  mm     G. Age:  29w 3d         83  %    CI:         74.2   %    70 - 86                                                          FL/HC:      21.2   %    19.6 - 20.8  HC:    269.95   mm     G. Age:  29w 3d         25  %    HC/AC:      1.06        0.99 - 1.21  AC:    255.64   mm     G. Age:  29w 5d         62  %    FL/BPD:     78.0   %    71 - 87  FL:      57.15  mm     G. Age:  30w 0d         59  %    FL/AC:      22.4   %    20 - 24  IOD:      15.2  mm     G. Age:  22w 5d         21  %  OOD:      47.6  mm     G. Age:  28w 1d         62  %  Est. FW:    1449  gm      3 lb 3 oz     60  % ---------------------------------------------------------------------- Gestational Age  LMP:           29w 1d        Date:  08/19/21                 EDD:   05/26/22  U/S Today:     29w 5d                                        EDD:   05/22/22  Best:          29w 1d     Det. By:  LMP  (08/19/21)          EDD:   05/26/22 ---------------------------------------------------------------------- Anatomy  Cranium:               Appears normal  Aortic Arch:            Appears normal  Cavum:                 Appears normal         Ductal Arch:            Appears normal  Ventricles:            Appears normal         Diaphragm:              Appears normal  Choroid Plexus:        Appears normal         Stomach:                Appears normal,  left                                                                        sided  Cerebellum:            Appears normal         Abdomen:                Appears normal  Posterior Fossa:       Appears normal         Abdominal Wall:         Appears nml (cord                                                                        insert, abd wall)  Nuchal Fold:           Not applicable (>86    Cord Vessels:           Appears normal ([redacted]                         wks GA)                                        vessel cord)  Face:                  Appears normal         Kidneys:                Appear normal                         (orbits and profile)  Lips:                  Appears normal         Bladder:                Appears normal  Thoracic:              Appears normal  Spine:                  Previously seen  Heart:                 Appears normal         Upper Extremities:      Previously seen                         (4CH, axis, and                         situs)  RVOT:                  Appears normal         Lower Extremities:      Previously seen  LVOT:                  Appears normal  Other:  Female fetus. Nasal bone, lenses, maxilla, mandible and falx          visualized VC, 3VV and 3VTV visualized. Heels/feet and open          hands/5th digits prior visualized. ---------------------------------------------------------------------- Impression  Patient return for fetal growth assessment.  She has history  of DVT and is on heparin anticoagulation prophylaxis.  She has a new diagnosis of gestational diabetes.  She is yet  to start checking her blood glucose.  Fetal growth is appropriate for gestational age.  Amniotic fluid  is normal and good fetal activity seen.  I discussed gestational diabetes and the importance of good  blood glucose control to prevent adverse fetal or neonatal  outcomes. ---------------------------------------------------------------------- Recommendations  -An appointment was made for her  to return in 4 weeks for  fetal growth assessment.  -If patient starts insulin or metformin, we will initiate weekly  BPP from her next visit till delivery. ----------------------------------------------------------------------                 Tama High, MD Electronically Signed Final Report   03/11/2022 09:41 am ----------------------------------------------------------------------  DG Chest Port 1 View  Result Date: 02/25/2022 CLINICAL DATA:  [id number] cough, fever and chills, wheezing at the right side of the chest EXAM: PORTABLE CHEST 1 VIEW COMPARISON:  February 26, 2020 FINDINGS: The heart size and mediastinal contours are within normal limits. Low lung volumes. There is some peribronchial cuffing seen. No consolidation, pleural effusion or vascular congestion. The visualized skeletal structures are unremarkable. IMPRESSION: There is some peribronchial cuffing seen of likely bronchitis. Otherwise, lungs are clear. Electronically Signed   By: Frazier Richards M.D.   On: 02/25/2022 13:17    Assessment and Plan:  Pregnancy: G1P0 at [redacted]w[redacted]d 1. History of maternal deep vein thrombosis (DVT)  2. Supervision of high risk pregnancy, antepartum Updated box Reviewed pelvic pain, counseled about safety of massage and chiropractor, prenatal yoga Given Peds list avs  3. Diet controlled gestational diabetes mellitus (GDM), antepartum Fasting- 80-94 2h pp 90-134 (only one above 120) Met with DM education   Preterm labor symptoms and general obstetric precautions including but not limited to vaginal bleeding, contractions, leaking of fluid and fetal movement were reviewed in detail with the patient. I discussed the assessment and treatment plan with the patient. The patient was provided an opportunity to ask questions and all were answered. The patient agreed with the plan and demonstrated an understanding of the instructions. The patient was advised  to call back or seek an in-person office  evaluation/go to MAU at [facility] for any urgent or concerning symptoms. Please refer to After Visit Summary for other counseling recommendations.   I provided 15 minutes of face-to-face time during this encounter.  Return in about 2 weeks (around 04/01/2022) for Routine prenatal care.  Future Appointments  Date Time Provider [place]  04/08/2022  8:30 AM [facility] NURSE [facility] [place]  04/08/2022  8:45 AM [facility] US5 [facility] [place], [place] for [employer], [place]

## 2022-04-08 ENCOUNTER — Other Ambulatory Visit: Payer: Self-pay | Admitting: *Deleted

## 2022-04-08 ENCOUNTER — Ambulatory Visit: Payer: 59 | Admitting: *Deleted

## 2022-04-08 ENCOUNTER — Ambulatory Visit: Payer: 59 | Attending: Obstetrics and Gynecology

## 2022-04-08 VITALS — HR 85

## 2022-04-08 DIAGNOSIS — O24419 Gestational diabetes mellitus in pregnancy, unspecified control: Secondary | ICD-10-CM | POA: Diagnosis not present

## 2022-04-08 DIAGNOSIS — O24410 Gestational diabetes mellitus in pregnancy, diet controlled: Secondary | ICD-10-CM

## 2022-04-08 DIAGNOSIS — E669 Obesity, unspecified: Secondary | ICD-10-CM

## 2022-04-08 DIAGNOSIS — Z3A33 33 weeks gestation of pregnancy: Secondary | ICD-10-CM

## 2022-04-08 DIAGNOSIS — Z86718 Personal history of other venous thrombosis and embolism: Secondary | ICD-10-CM

## 2022-04-08 DIAGNOSIS — O0990 Supervision of high risk pregnancy, unspecified, unspecified trimester: Secondary | ICD-10-CM | POA: Diagnosis present

## 2022-04-08 DIAGNOSIS — O2233 Deep phlebothrombosis in pregnancy, third trimester: Secondary | ICD-10-CM

## 2022-04-08 DIAGNOSIS — O99213 Obesity complicating pregnancy, third trimester: Secondary | ICD-10-CM | POA: Insufficient documentation

## 2022-04-08 DIAGNOSIS — R638 Other symptoms and signs concerning food and fluid intake: Secondary | ICD-10-CM

## 2022-04-13 ENCOUNTER — Telehealth: Payer: Self-pay

## 2022-04-13 NOTE — Telephone Encounter (Signed)
Patient called front office to inquire about need to change Lovenox to Heparin at 36w. Patient states she was told this earlier in her pregnancy but has not been addressed recently. Per chart review MFM recommends changing Lovenox 40 mg daily to Heparin 10,000 units BID at 36 weeks until immediately following delivery. Returned call to patient to explain Dr. Vergie Living will review with patient at upcoming appt on 04/19/22.

## 2022-04-19 ENCOUNTER — Telehealth (INDEPENDENT_AMBULATORY_CARE_PROVIDER_SITE_OTHER): Payer: 59 | Admitting: Obstetrics and Gynecology

## 2022-04-19 VITALS — BP 130/80

## 2022-04-19 DIAGNOSIS — O0990 Supervision of high risk pregnancy, unspecified, unspecified trimester: Secondary | ICD-10-CM

## 2022-04-19 DIAGNOSIS — Z86718 Personal history of other venous thrombosis and embolism: Secondary | ICD-10-CM

## 2022-04-19 DIAGNOSIS — O24410 Gestational diabetes mellitus in pregnancy, diet controlled: Secondary | ICD-10-CM

## 2022-04-19 DIAGNOSIS — Z3A34 34 weeks gestation of pregnancy: Secondary | ICD-10-CM

## 2022-04-19 DIAGNOSIS — O09293 Supervision of pregnancy with other poor reproductive or obstetric history, third trimester: Secondary | ICD-10-CM

## 2022-04-19 DIAGNOSIS — Z7901 Long term (current) use of anticoagulants: Secondary | ICD-10-CM

## 2022-04-19 HISTORY — DX: Long term (current) use of anticoagulants: Z79.01

## 2022-04-19 NOTE — Progress Notes (Signed)
Virtual Visit via Video Note  I connected with Tina Phillips on 04/19/22 at  8:55 AM EDT by a video enabled telemedicine application and verified that I am speaking with the correct person using two identifiers.  Location: Patient: home Provider: [facility]   I discussed the limitations of evaluation and management by telemedicine and the availability of in person appointments. The patient expressed understanding and agreed to proceed.  History of Present Illness:    Observations/Objective: Patient would like to speak to provider regarding being started on Heparin, states she was told she would be starting a blood thinner around this time.    Assessment and Plan:   Follow Up Instructions:    I discussed the assessment and treatment plan with the patient. The patient was provided an opportunity to ask questions and all were answered. The patient agreed with the plan and demonstrated an understanding of the instructions.   The patient was advised to call back or seek an in-person evaluation if the symptoms worsen or if the condition fails to improve as anticipated.  I provided 8 minutes of non-face-to-face time during this encounter.   Aviva Signs, CMA

## 2022-04-19 NOTE — Progress Notes (Signed)
    TELEHEALTH OBSTETRICS VISIT ENCOUNTER NOTE  Provider location: [facility]   Patient location: Home  I connected with [name] on 04/19/22 at  8:55 AM EDT by telephone at home and verified that I am speaking with the correct person using two identifiers. Of note, unable to do video encounter due to technical difficulties.    I discussed the limitations, risks, security and privacy concerns of performing an evaluation and management service by telephone and the availability of in person appointments. I also discussed with the patient that there may be a patient responsible charge related to this service. The patient expressed understanding and agreed to proceed.  Subjective:  Tina Phillips is a 31 y.o. G1P0 at [redacted]w[redacted]d being followed for ongoing prenatal care.  She is currently monitored for the following issues for this high-risk pregnancy and has Migraine without aura; Supervision of high risk pregnancy, antepartum; History of maternal deep vein thrombosis (DVT); Anemia; COVID-19 affecting pregnancy in third trimester; Gestational diabetes mellitus (GDM), antepartum; and Anticoagulant long-term use on their problem list.  Patient reports no complaints. Reports fetal movement. Denies any contractions, bleeding or leaking of fluid.   The following portions of the patient's history were reviewed and updated as appropriate: allergies, current medications, past family history, past medical history, past social history, past surgical history and problem list.   Objective:  Blood pressure 130/80, last menstrual period 08/19/2021. General:  Alert, oriented and cooperative.   Mental Status: Normal mood and affect perceived. Normal judgment and thought content.  Rest of physical exam deferred due to type of encounter  Assessment and Plan:  Pregnancy: G1P0 at [redacted]w[redacted]d 1. Supervision of high risk pregnancy, antepartum GBS, GC/CT nv. Ask more about  birth control then, no estrogen options - [place] MFM FETAL BPP WO NON STRESS; Future  2. [redacted] weeks gestation of pregnancy - [place] MFM FETAL BPP WO NON STRESS; Future  3. History of maternal deep vein thrombosis (DVT) Confirms on lovenox ppx. Mfm prior note recommended switching to heparin nv. Can d/w her more then. Has 9/15 growth u/s. Will add on bpp to that   4. Diet controlled gestational diabetes mellitus (GDM), antepartum Normal CBGs.  8/17: 50%, 2195g, ac 67%, afi 16  5. Anticoagulant long-term use See above  Preterm labor symptoms and general obstetric precautions including but not limited to vaginal bleeding, contractions, leaking of fluid and fetal movement were reviewed in detail with the patient.  I discussed the assessment and treatment plan with the patient. The patient was provided an opportunity to ask questions and all were answered. The patient agreed with the plan and demonstrated an understanding of the instructions. The patient was advised to call back or seek an in-person office evaluation/[facility] for any urgent or concerning symptoms. Please refer to After Visit Summary for other counseling recommendations.   I provided 7 minutes of non-face-to-face time during this encounter.  Return in about 2 weeks (around 05/03/2022) for in person, md visit, high risk ob.  Future Appointments  Date Time Provider Department Center  05/07/2022  9:30 AM [facility] NURSE [facility]  05/07/2022  9:45 AM [facility] US4 [facility]    [place], MD Center for [employer], [facility]

## 2022-04-28 ENCOUNTER — Telehealth: Payer: Self-pay | Admitting: Family Medicine

## 2022-04-28 NOTE — Telephone Encounter (Signed)
Patient called in wanting to speak with a nurse about a prescription

## 2022-04-28 NOTE — Telephone Encounter (Signed)
Called patient stating I am returning her phone call. Patient states she was told she would need to switch blood thinners but hasn't heard an update on this. Told patient that looking at her last visit note, the doctor mentioned this would be discussed at her next visit. Patient states she was told that was at 37 weeks which is when her next appt is. Told patient yes, we will address it at that time. Patient states she is supposed to be getting a breast pump but hasn't heard anything. Patient states she reached out to her insurance company for this but they haven't received anything. Told patient we haven't received anything and recommended she follow back up with her insurance to make sure they fax the form to [place] and provided our fax number. 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 Told patient we haven't received anything yet and recommended she follow up with them to make sure they have the correct fax number. Patient verbalized understanding & had no other questions.

## 2022-05-05 ENCOUNTER — Ambulatory Visit (INDEPENDENT_AMBULATORY_CARE_PROVIDER_SITE_OTHER): Payer: 59 | Admitting: Family Medicine

## 2022-05-05 ENCOUNTER — Other Ambulatory Visit: Payer: Self-pay

## 2022-05-05 ENCOUNTER — Other Ambulatory Visit (HOSPITAL_COMMUNITY)
Admission: RE | Admit: 2022-05-05 | Discharge: 2022-05-05 | Disposition: A | Discharge status: Home / Self Care | Payer: 59 | Source: Ambulatory Visit | Attending: Family Medicine | Admitting: Family Medicine

## 2022-05-05 VITALS — BP 108/73 | HR 80 | Wt 221.0 lb

## 2022-05-05 DIAGNOSIS — O0990 Supervision of high risk pregnancy, unspecified, unspecified trimester: Secondary | ICD-10-CM

## 2022-05-05 DIAGNOSIS — Z7901 Long term (current) use of anticoagulants: Secondary | ICD-10-CM

## 2022-05-05 DIAGNOSIS — Z0289 Encounter for other administrative examinations: Secondary | ICD-10-CM

## 2022-05-05 DIAGNOSIS — O24410 Gestational diabetes mellitus in pregnancy, diet controlled: Secondary | ICD-10-CM

## 2022-05-05 DIAGNOSIS — Z86718 Personal history of other venous thrombosis and embolism: Secondary | ICD-10-CM

## 2022-05-05 DIAGNOSIS — Z8759 Personal history of other complications of pregnancy, childbirth and the puerperium: Secondary | ICD-10-CM

## 2022-05-05 MED ORDER — HEPARIN SODIUM (PORCINE) 10000 UNIT/ML IJ SOLN: 10000.0000 [IU] | Freq: Two times a day (BID) | INTRAMUSCULAR | 0 refills | Status: DC

## 2022-05-05 NOTE — Patient Instructions (Addendum)
Start taking your heparin tonight at the same time you would have taken your lovenox.  If you can't get the heparin today, take your lovenox tonight, pick up the heparin tomorrow, and start the heparin tomorrow night.  Remember to take the heparin every 12 hours.

## 2022-05-05 NOTE — Progress Notes (Signed)
   Subjective:  Tina Phillips is a 31 y.o. G1P0 at [redacted]w[redacted]d being seen today for ongoing prenatal care.  She is currently monitored for the following issues for this high-risk pregnancy and has Migraine without aura; Supervision of high risk pregnancy, antepartum; History of maternal deep vein thrombosis (DVT); Anemia; COVID-19 affecting pregnancy in third trimester; Gestational diabetes mellitus (GDM), antepartum; and Anticoagulant long-term use on their problem list.  Patient reports no complaints.  Contractions: Irritability. Vag. Bleeding: None.  Movement: Present. Denies leaking of fluid.   The following portions of the patient's history were reviewed and updated as appropriate: allergies, current medications, past family history, past medical history, past social history, past surgical history and problem list. Problem list updated.  Objective:   Vitals:   05/05/22 1647  BP: 108/73  Pulse: 80  Weight: 221 lb (100.2 kg)    Fetal Status: Fetal Heart Rate (bpm): 136   Movement: Present  Presentation: Vertex  General:  Alert, oriented and cooperative. Patient is in no acute distress.  Skin: Skin is warm and dry. No rash noted.   Cardiovascular: Normal heart rate noted  Respiratory: Normal respiratory effort, no problems with respiration noted  Abdomen: Soft, gravid, appropriate for gestational age. Pain/Pressure: Present     Pelvic: Vag. Bleeding: None     Cervical exam performed Dilation: Fingertip Effacement (%): 70 Station: -3  Extremities: Normal range of motion.     Mental Status: Normal mood and affect. Normal behavior. Normal judgment and thought content.   Urinalysis:      Assessment and Plan:  Pregnancy: G1P0 at [redacted]w[redacted]d  1. Supervision of high risk pregnancy, antepartum BP and FHR normal Swabs collected Cervix fingertip and very soft - GC/Chlamydia probe amp ([place])not at [facility] - Culture, beta strep (group b only)  2. Anticoagulant long-term use Per initial MFM  consult note, instructed to transition from lovenox to heparin 10k units BID Usually takes dose at night, instructed to start taking tonight if she can get it from the pharmacy, if not should take lovenox tonight and start tomorrow night - heparin 09735 UNIT/ML injection; Inject 1 mL (10,000 Units total) into the skin every 12 (twelve) hours.  Dispense: 60 mL; Refill: 0  3. Diet controlled gestational diabetes mellitus (GDM), antepartum Does not have log Reports good numbers from recall EFW 50%, AFI 16 on most recent growth [place] Has repeat in two days Discussed rationale for IOL at 39-40 weeks She is very much hoping for natural labor though understanding of MFM guidelines Recommended we defer scheduling until growth [place] in a few days, barring any abnormalities such as poly or macrosomia, not unreasonable to delay until around her due date  4. History of maternal deep vein thrombosis (DVT) See above  Term labor symptoms and general obstetric precautions including but not limited to vaginal bleeding, contractions, leaking of fluid and fetal movement were reviewed in detail with the patient. Please refer to After Visit Summary for other counseling recommendations.  Return in 1 week (on 05/12/2022) for [facility], ob visit.   Venora Maples, MD

## 2022-05-07 ENCOUNTER — Ambulatory Visit: Payer: 59

## 2022-05-07 LAB — GC/CHLAMYDIA PROBE AMP (~~LOC~~) NOT AT ARMC
Chlamydia: NEGATIVE
Comment: NEGATIVE
Comment: NORMAL
Neisseria Gonorrhea: NEGATIVE

## 2022-05-09 ENCOUNTER — Other Ambulatory Visit: Payer: Self-pay | Admitting: Family Medicine

## 2022-05-09 MED ORDER — INSULIN SYRINGE-NEEDLE U-100 31G X 15/64" 1 ML MISC
1.0000 | Freq: Two times a day (BID) | 0 refills | Status: DC
Start: 2022-05-09 — End: 2022-06-02

## 2022-05-10 ENCOUNTER — Encounter: Payer: Self-pay | Admitting: Family Medicine

## 2022-05-10 LAB — CULTURE, BETA STREP (GROUP B ONLY): Strep Gp B Culture: NEGATIVE

## 2022-05-13 ENCOUNTER — Other Ambulatory Visit: Payer: Self-pay | Admitting: *Deleted

## 2022-05-13 ENCOUNTER — Ambulatory Visit: Payer: 59 | Admitting: *Deleted

## 2022-05-13 ENCOUNTER — Ambulatory Visit: Payer: 59 | Attending: Obstetrics and Gynecology

## 2022-05-13 ENCOUNTER — Encounter: Payer: 59 | Admitting: Obstetrics and Gynecology

## 2022-05-13 VITALS — BP 121/73 | HR 79

## 2022-05-13 DIAGNOSIS — Z7901 Long term (current) use of anticoagulants: Secondary | ICD-10-CM

## 2022-05-13 DIAGNOSIS — Z362 Encounter for other antenatal screening follow-up: Secondary | ICD-10-CM

## 2022-05-13 DIAGNOSIS — Z86718 Personal history of other venous thrombosis and embolism: Secondary | ICD-10-CM

## 2022-05-13 DIAGNOSIS — O09293 Supervision of pregnancy with other poor reproductive or obstetric history, third trimester: Secondary | ICD-10-CM

## 2022-05-13 DIAGNOSIS — Z3A34 34 weeks gestation of pregnancy: Secondary | ICD-10-CM | POA: Diagnosis present

## 2022-05-13 DIAGNOSIS — Z3A38 38 weeks gestation of pregnancy: Secondary | ICD-10-CM

## 2022-05-13 DIAGNOSIS — O99213 Obesity complicating pregnancy, third trimester: Secondary | ICD-10-CM | POA: Diagnosis not present

## 2022-05-13 DIAGNOSIS — O24419 Gestational diabetes mellitus in pregnancy, unspecified control: Secondary | ICD-10-CM

## 2022-05-13 DIAGNOSIS — E669 Obesity, unspecified: Secondary | ICD-10-CM | POA: Diagnosis not present

## 2022-05-13 DIAGNOSIS — R638 Other symptoms and signs concerning food and fluid intake: Secondary | ICD-10-CM | POA: Diagnosis present

## 2022-05-13 DIAGNOSIS — O24410 Gestational diabetes mellitus in pregnancy, diet controlled: Secondary | ICD-10-CM | POA: Diagnosis present

## 2022-05-13 DIAGNOSIS — O0990 Supervision of high risk pregnancy, unspecified, unspecified trimester: Secondary | ICD-10-CM

## 2022-05-14 ENCOUNTER — Inpatient Hospital Stay (HOSPITAL_COMMUNITY)
Admission: AD | Admit: 2022-05-14 | Discharge: 2022-05-15 | Disposition: A | Discharge status: Home / Self Care | Payer: 59 | Attending: Obstetrics and Gynecology | Admitting: Obstetrics and Gynecology

## 2022-05-14 ENCOUNTER — Encounter (HOSPITAL_COMMUNITY): Payer: Self-pay | Admitting: Obstetrics and Gynecology

## 2022-05-14 DIAGNOSIS — O99891 Other specified diseases and conditions complicating pregnancy: Secondary | ICD-10-CM | POA: Insufficient documentation

## 2022-05-14 DIAGNOSIS — O1203 Gestational edema, third trimester: Secondary | ICD-10-CM | POA: Insufficient documentation

## 2022-05-14 DIAGNOSIS — R600 Localized edema: Secondary | ICD-10-CM | POA: Diagnosis not present

## 2022-05-14 DIAGNOSIS — R0602 Shortness of breath: Secondary | ICD-10-CM | POA: Insufficient documentation

## 2022-05-14 DIAGNOSIS — Z3A38 38 weeks gestation of pregnancy: Secondary | ICD-10-CM

## 2022-05-14 DIAGNOSIS — O99413 Diseases of the circulatory system complicating pregnancy, third trimester: Secondary | ICD-10-CM | POA: Insufficient documentation

## 2022-05-14 DIAGNOSIS — O0990 Supervision of high risk pregnancy, unspecified, unspecified trimester: Secondary | ICD-10-CM

## 2022-05-14 DIAGNOSIS — O26893 Other specified pregnancy related conditions, third trimester: Secondary | ICD-10-CM | POA: Insufficient documentation

## 2022-05-14 DIAGNOSIS — O0993 Supervision of high risk pregnancy, unspecified, third trimester: Secondary | ICD-10-CM | POA: Diagnosis not present

## 2022-05-14 LAB — COMPREHENSIVE METABOLIC PANEL
ALT: 21 U/L (ref 0–44)
AST: 19 U/L (ref 15–41)
Albumin: 2.6 g/dL — ABNORMAL LOW (ref 3.5–5.0)
Alkaline Phosphatase: 187 U/L — ABNORMAL HIGH (ref 38–126)
Anion gap: 5 (ref 5–15)
BUN: 7 mg/dL (ref 6–20)
CO2: 24 mmol/L (ref 22–32)
Calcium: 9.8 mg/dL (ref 8.9–10.3)
Chloride: 107 mmol/L (ref 98–111)
Creatinine, Ser: 0.81 mg/dL (ref 0.44–1.00)
GFR, Estimated: >60 mL/min (ref >60)
Glucose, Bld: 116 mg/dL — ABNORMAL HIGH (ref 70–99)
Potassium: 3.6 mmol/L (ref 3.5–5.1)
Sodium: 136 mmol/L (ref 135–145)
Total Bilirubin: 0.6 mg/dL (ref 0.3–1.2)
Total Protein: 6 g/dL — ABNORMAL LOW (ref 6.5–8.1)

## 2022-05-14 LAB — CBC WITH DIFFERENTIAL/PLATELET
Abs Immature Granulocytes: 0.03 10*3/uL (ref 0.00–0.07)
Basophils Absolute: 0 10*3/uL (ref 0.0–0.1)
Basophils Relative: 0 %
Eosinophils Absolute: 0.1 10*3/uL (ref 0.0–0.5)
Eosinophils Relative: 1 %
HCT: 35.8 % — ABNORMAL LOW (ref 36.0–46.0)
Hemoglobin: 11.4 g/dL — ABNORMAL LOW (ref 12.0–15.0)
Immature Granulocytes: 0 %
Lymphocytes Relative: 24 %
Lymphs Abs: 2.5 10*3/uL (ref 0.7–4.0)
MCH: 29.3 pg (ref 26.0–34.0)
MCHC: 31.8 g/dL (ref 30.0–36.0)
MCV: 92 fL (ref 80.0–100.0)
Monocytes Absolute: 1.2 10*3/uL — ABNORMAL HIGH (ref 0.1–1.0)
Monocytes Relative: 12 %
Neutro Abs: 6.4 10*3/uL (ref 1.7–7.7)
Neutrophils Relative %: 63 %
Platelets: 224 10*3/uL (ref 150–400)
RBC: 3.89 MIL/uL (ref 3.87–5.11)
RDW: 14.5 % (ref 11.5–15.5)
WBC: 10.2 10*3/uL (ref 4.0–10.5)
nRBC: 0 % (ref 0.0–0.2)

## 2022-05-14 LAB — PROTEIN / CREATININE RATIO, URINE
Creatinine, Urine: 101 mg/dL
Protein Creatinine Ratio: 0.12 mg/mg{Cre} (ref 0.00–0.15)
Total Protein, Urine: 12 mg/dL

## 2022-05-14 MED ORDER — HEPARIN SODIUM (PORCINE) 10000 UNIT/ML IJ SOLN
10000.0000 [IU] | Freq: Once | INTRAMUSCULAR | Status: DC
  Filled 2022-05-14: qty 1

## 2022-05-14 NOTE — MAU Note (Signed)
Pt says her feet are swollen - more on right  [facility]- clinic Talked to nurse yesterday - told to come yesterday- worked today- couldn't miss Was SOB- some yesterday and today

## 2022-05-14 NOTE — MAU Provider Note (Signed)
History     CSN: [id number]  Arrival date and time: 05/14/22 2105   Event Date/Time   First Provider Initiated Contact with Patient 05/14/22 2213      Chief Complaint  Patient presents with   Foot Swelling   Shortness of Breath   Tina Phillips is a 31 y.o. G1P0 at 36w2dhere today with leg swelling and shortness of breath. She is concerned about a blood clot. Patient had DVT in 2019, likely related to OCPs. She has been on lovenox for this pregnancy. She was not on any other anticoagulants prior to pregnancy after completing treatment for the DVT. She was rx'ed Heparin, but didn't get needles. So she didn't start heparin until 9/19, but she continued the lovenox until 9/18. She did the heparin for one day, but could not tolerate the injection. She reports that it is much more painful.   Patient denies any contractions, VB or  LOF. She reports normal fetal movement.   Patient is here today with shortness of breath and leg swelling. However this has been ongoing for a few weeks and is not new since stopping her anticoagulant. 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 She reports the shortness of breath is intermittent and worse when she is in the shower or going up stairs.   Shortness of Breath    OB History     Gravida  1   Para      Term      Preterm      AB      Living         SAB      IAB      Ectopic      Multiple      Live Births              Past Medical History:  Diagnosis Date   Anemia    DV[place] ([place]   Right posterior tibial vein DVT in 2019 while on oral contraceptives   Ventral hernia     Past Surgical History:  Procedure Laterality Date   INSERTION OF MESH N/A 12/30/2017   Procedure: INSERTION OF MESH;  Surgeon: CClovis Riley MD;  Location: [facility] OR;  Service: General;  Laterality: N/A;   POLYPECTOMY     VENTRAL HERNIA REPAIR N/A 12/30/2017   Procedure: LAPAROSCOPIC VENTRAL HERNIA WITH MESH;  Surgeon: CClovis Riley MD;  Location: [facility] OR;  Service: General;  Laterality: N/A;   WISDOM TOOTH EXTRACTION      Family History  Problem Relation Age of Onset   Diabetes Mother    Deep vein thrombosis Mother    Congestive Heart Failure Mother    Diabetes Father    Hyperlipidemia Father    Post-traumatic stress disorder Father    Prostate cancer Father    Deep vein thrombosis Father    Cancer - Prostate Father     Social History   Tobacco Use   Smoking status: Never   Smokeless tobacco: Never  Vaping Use   Vaping Use: Never used  Substance Use Topics   Alcohol use: No   Drug use: No    Allergies: No Known Allergies  Medications Prior to Admission  Medication Sig Dispense Refill Last Dose   Accu-Chek Softclix Lancets lancets Use as instructed 100 each 12 05/13/2022   aspirin EC 81 MG tablet Take 1 tablet (81 mg total) by mouth daily. Take after 12 weeks for prevention of preeclampsia  later in pregnancy 300 tablet 2 Past Month   Blood Glucose Monitoring Suppl (ACCU-CHEK GUIDE) w/Device KIT 1 Device by Does not apply route as needed. 1 kit 0 05/13/2022   ferrous sulfate 325 (65 FE) MG EC tablet Take 325 mg by mouth 3 (three) times daily with meals.   Past Month   glucose blood (ACCU-CHEK GUIDE) test strip Use as instructed 100 each 12 05/13/2022   heparin 10000 UNIT/ML injection Inject 1 mL (10,000 Units total) into the skin every 12 (twelve) hours. 60 mL 0 Past Week   Prenatal Vit-Fe Fumarate-FA (PRENATAL 1+1 PO) Prenatal 1   Past Month   Insulin Syringe-Needle U-100 31G X 15/64" 1 ML MISC 1 Syringe by Does not apply route in the morning and at bedtime. 30 each 0    promethazine (PHENERGAN) 25 MG tablet Take 1 tablet (25 mg total) by mouth every 6 (six) hours as needed for nausea or vomiting. 30 tablet 0     Review of Systems  Respiratory:  Positive for shortness of breath.    Physical Exam   Blood pressure 120/71, pulse 88, temperature 98.5 F (36.9 C), temperature source Oral, resp.  rate 20, height _0  (1.702 m), weight 104.1 kg, last menstrual period 08/19/2021.  Physical Exam Constitutional:      Appearance: She is well-developed.  HENT:     Head: Normocephalic.  Eyes:     Pupils: Pupils are equal, round, and reactive to light.  Cardiovascular:     Rate and Rhythm: Normal rate and regular rhythm.     Heart sounds: Normal heart sounds.  Pulmonary:     Effort: Pulmonary effort is normal. No respiratory distress.     Breath sounds: Normal breath sounds.  Abdominal:     Palpations: Abdomen is soft.     Tenderness: There is no abdominal tenderness.  Genitourinary:    Vagina: No bleeding. Vaginal discharge: mucusy.    Comments: External: no lesion Vagina: small amount of white discharge     Musculoskeletal:        General: Normal range of motion.     Cervical back: Normal range of motion and neck supple.     Comments: Right calf: 40cm Left calf: 39.5cm   Skin:    General: Skin is warm and dry.  Neurological:     Mental Status: She is alert and oriented to person, place, and time.  Psychiatric:        Mood and Affect: Mood normal.        Behavior: Behavior normal.      NST:  Baseline: 135 Variability: moderate Accels: 15x15 Decels: none Toco: none Reactive/Appropriate for GA   Results for orders placed or performed during the hospital encounter of 05/14/22 (from the past 24 hour(s))  Protein / creatinine ratio, urine     Status: None   Collection Time: 05/14/22  9:29 PM  Result Value Ref Range   Creatinine, Urine 101 mg/dL   Total Protein, Urine 12 mg/dL   Protein Creatinine Ratio 0.12 0.00 - 0.15 mg/mg[Cre]  CBC with Differential/Platelet     Status: Abnormal   Collection Time: 05/14/22 10:01 PM  Result Value Ref Range   WBC 10.2 4.0 - 10.5 K/uL   RBC 3.89 3.87 - 5.11 MIL/uL   Hemoglobin 11.4 (L) 12.0 - 15.0 g/dL   HCT 35.8 (L) 36.0 - 46.0 %   MCV 92.0 80.0 - 100.0 fL   MCH 29.3 26.0 - 34.0 pg   MCHC 31.8  30.0 - 36.0 g/dL   RDW  14.5 11.5 - 15.5 %   Platelets 224 150 - 400 K/uL   nRBC 0.0 0.0 - 0.2 %   Neutrophils Relative % 63 %   Neutro Abs 6.4 1.7 - 7.7 K/uL   Lymphocytes Relative 24 %   Lymphs Abs 2.5 0.7 - 4.0 K/uL   Monocytes Relative 12 %   Monocytes Absolute 1.2 (H) 0.1 - 1.0 K/uL   Eosinophils Relative 1 %   Eosinophils Absolute 0.1 0.0 - 0.5 K/uL   Basophils Relative 0 %   Basophils Absolute 0.0 0.0 - 0.1 K/uL   Immature Granulocytes 0 %   Abs Immature Granulocytes 0.03 0.00 - 0.07 K/uL  Comprehensive metabolic panel     Status: Abnormal   Collection Time: 05/14/22 10:01 PM  Result Value Ref Range   Sodium 136 135 - 145 mmol/L   Potassium 3.6 3.5 - 5.1 mmol/L   Chloride 107 98 - 111 mmol/L   CO2 24 22 - 32 mmol/L   Glucose, Bld 116 (H) 70 - 99 mg/dL   BUN 7 6 - 20 mg/dL   Creatinine, Ser 0.81 0.44 - 1.00 mg/dL   Calcium 9.8 8.9 - 10.3 mg/dL   Total Protein 6.0 (L) 6.5 - 8.1 g/dL   Albumin 2.6 (L) 3.5 - 5.0 g/dL   AST 19 15 - 41 U/L   ALT 21 0 - 44 U/L   Alkaline Phosphatase 187 (H) 38 - 126 U/L   Total Bilirubin 0.6 0.3 - 1.2 mg/dL   GFR, Estimated >60 >60 mL/min   Anion gap 5 5 - 15    MAU Course  Procedures  MDM  EKG: NSR  DW patient that there is low suspicion for blood clot at this time. Calf sizes are symmetrical, no tenderness, negative homans' sign, EKG normal, 02 sat normal and shortness of breath has been ongoing for a while now and more exertional. RN will teach patient ways to give heparin injection to hopefully show her how to give it in a way that will not be as painful. Patient only has a couple more weeks of the pregnancy and it would be beneficial if she could continue the heparin as directed.   RN showed patient how to give heparin injection and offered teaching on how to make it hurt less.    Assessment and Plan   1. Supervision of high risk pregnancy, antepartum   2. Pedal edema   3. Shortness of breath due to pregnancy in third trimester   4. [redacted] weeks  gestation of pregnancy    DC home Comfort measures reviewed  1st/2nd/3rd Trimester precautions  Bleeding precautions Ectopic precautions PTL precautions  Fetal kick counts RX: Return to MAU as needed FU with OB as planned   [place] [facility] [place] [facility] Follow up.   Specialty: Obstetrics and Gynecology Contact information: [place] [id number] [place] DNP, CNM  05/14/22  11:21 PM

## 2022-05-15 NOTE — MAU Note (Signed)
Discharge instructions given. Pt educated on heparin dose ordered before discharge with administration review. Pt states that she is aware of proper administration technique and she declines the heparin prior to discharge. Pt is unwilling to wait to speak with provider or pharmacy. Vital signs stable pt denies pain or discomfort. States she keep next appointment as scheduled and will return to MAU as needed.

## 2022-05-15 NOTE — MAU Note (Signed)
Provider aware of pts refusal of heparin prior to discharge and statement that she will not take heparin at home. Pt unwilling to discuss concerns with medication and states she is aware of risk.

## 2022-05-16 ENCOUNTER — Encounter: Payer: Self-pay | Admitting: Radiology

## 2022-05-18 ENCOUNTER — Ambulatory Visit (INDEPENDENT_AMBULATORY_CARE_PROVIDER_SITE_OTHER): Payer: Self-pay | Admitting: Pediatrics

## 2022-05-18 DIAGNOSIS — Z7681 Expectant parent(s) prebirth pediatrician visit: Secondary | ICD-10-CM

## 2022-05-18 NOTE — Progress Notes (Signed)
Prenatal counseling for impending newborn done-- Z76.81  

## 2022-05-20 ENCOUNTER — Ambulatory Visit: Payer: 59 | Attending: Maternal & Fetal Medicine

## 2022-05-20 ENCOUNTER — Ambulatory Visit (INDEPENDENT_AMBULATORY_CARE_PROVIDER_SITE_OTHER): Payer: 59 | Admitting: Obstetrics and Gynecology

## 2022-05-20 ENCOUNTER — Ambulatory Visit: Payer: 59 | Admitting: *Deleted

## 2022-05-20 ENCOUNTER — Encounter: Payer: Self-pay | Admitting: Obstetrics and Gynecology

## 2022-05-20 VITALS — BP 100/69 | HR 90

## 2022-05-20 VITALS — BP 100/69 | HR 90 | Wt 225.1 lb

## 2022-05-20 DIAGNOSIS — Z3A39 39 weeks gestation of pregnancy: Secondary | ICD-10-CM

## 2022-05-20 DIAGNOSIS — O24410 Gestational diabetes mellitus in pregnancy, diet controlled: Secondary | ICD-10-CM | POA: Diagnosis not present

## 2022-05-20 DIAGNOSIS — O24419 Gestational diabetes mellitus in pregnancy, unspecified control: Secondary | ICD-10-CM | POA: Diagnosis present

## 2022-05-20 DIAGNOSIS — Z86718 Personal history of other venous thrombosis and embolism: Secondary | ICD-10-CM | POA: Insufficient documentation

## 2022-05-20 DIAGNOSIS — E669 Obesity, unspecified: Secondary | ICD-10-CM | POA: Diagnosis not present

## 2022-05-20 DIAGNOSIS — O0990 Supervision of high risk pregnancy, unspecified, unspecified trimester: Secondary | ICD-10-CM

## 2022-05-20 DIAGNOSIS — O2233 Deep phlebothrombosis in pregnancy, third trimester: Secondary | ICD-10-CM

## 2022-05-20 DIAGNOSIS — Z8759 Personal history of other complications of pregnancy, childbirth and the puerperium: Secondary | ICD-10-CM

## 2022-05-20 DIAGNOSIS — Z7901 Long term (current) use of anticoagulants: Secondary | ICD-10-CM

## 2022-05-20 DIAGNOSIS — O99213 Obesity complicating pregnancy, third trimester: Secondary | ICD-10-CM

## 2022-05-20 DIAGNOSIS — O0993 Supervision of high risk pregnancy, unspecified, third trimester: Secondary | ICD-10-CM

## 2022-05-20 NOTE — Patient Instructions (Signed)
Vaginal Delivery  Vaginal delivery means that you give birth by pushing your baby out of your birth canal (vagina). Your health care team will help you before, during, and after vaginal delivery. Birth experiences are unique for every woman and every pregnancy, and birth experiences vary depending on where you choose to give birth. What are the risks and benefits? Generally, this is safe. However, problems may occur, including: Bleeding. Infection. Damage to other structures such as vaginal tearing. Allergic reactions to medicines. Despite the risks, benefits of vaginal delivery include less risk of bleeding and infection and a shorter recovery time compared to a Cesarean delivery. Cesarean delivery, or C-section, is the surgical delivery of a baby. What happens when I arrive at the birth center or hospital? Once you are in labor and have been admitted into the hospital or birth center, your health care team may: Review your pregnancy history and any concerns that you have. Talk with you about your birth plan and discuss pain control options. Check your blood pressure, breathing, and heartbeat. Assess your baby's heartbeat. Monitor your uterus for contractions. Check whether your bag of water (amniotic sac) has broken (ruptured). Insert an IV into one of your veins. This may be used to give you fluids and medicines. Monitoring Your health care team may assess your contractions (uterine monitoring) and your baby's heart rate (fetal monitoring). You may need to be monitored: Often, but not continuously (intermittently). All the time or for long periods at a time (continuously). Continuous monitoring may be needed if: You are taking certain medicines, such as medicine to relieve pain or make your contractions stronger. You have pregnancy or labor complications. Monitoring may be done by: Placing a special stethoscope or a handheld monitoring device on your abdomen to check your baby's  heartbeat and to check for contractions. Placing monitors on your abdomen (external monitors) to record your baby's heartbeat and the frequency and length of contractions. Placing monitors inside your uterus through your vagina (internal monitors) to record your baby's heartbeat and the frequency, length, and strength of your contractions. Depending on the type of monitor, it may remain in your uterus or on your baby's head until birth. Telemetry. This is a type of continuous monitoring that can be done with external or internal monitors. Instead of having to stay in bed, you are able to move around. Physical exam Your health care team may perform frequent physical exams. This may include: Checking how and where your baby is positioned in your uterus. Checking your cervix to determine: Whether it is thinning out (effacing). Whether it is opening up (dilating). What happens during labor and delivery?  Normal labor and delivery is divided into the following three stages: Stage 1 This is the longest stage of labor. Throughout this stage, you will feel contractions. Contractions generally feel mild, infrequent, and irregular at first. They get stronger, more frequent, and more regular as you move through this stage. You may have contractions about every 2-3 minutes. This stage ends when your cervix is completely dilated to 4 inches (10 cm) and completely effaced. Stage 2 This stage starts once your cervix is completely effaced and dilated and lasts until the delivery of your baby. This is the stage where you will feel an urge to push your baby out of your vagina. You may feel stretching and burning pain, especially when the widest part of your baby's head passes through the vaginal opening (crowning). Once your baby is delivered, the umbilical cord will be   clamped and cut. Timing of cutting the cord will depend on your wishes, your baby's health, and your health care provider's practices. Your baby  will be placed on your bare chest (skin-to-skin contact) in an upright position and covered with a warm blanket. If you are choosing to breastfeed, watch your baby for feeding cues, like rooting or sucking, and help the baby to your breast for his or her first feeding. Stage 3 This stage starts immediately after the birth of your baby and ends after you deliver the placenta. This stage may take anywhere from 5 to 30 minutes. After your baby has been delivered, you will feel contractions as your body expels the placenta. These contractions also help your uterus get smaller and reduce bleeding. What can I expect after labor and delivery? After labor is over, you and your baby will be assessed closely until you are ready to go home. Your health care team will teach you how to care for yourself and your baby. You and your baby may be encouraged to stay in the same room (rooming in) during your hospital stay. This will help promote early bonding and successful breastfeeding. Your uterus will be checked and massaged regularly (fundal massage). You may continue to receive fluids and medicines through an IV. You will have some soreness and pain in your abdomen, vagina, and the area of skin between your vaginal opening and your anus (perineum). If an incision was made near your vagina (episiotomy) or if you had some vaginal tearing during delivery, cold compresses may be placed on your episiotomy or your tear. This helps to reduce pain and swelling. It is normal to have vaginal bleeding after delivery. Wear a sanitary pad for vaginal bleeding and discharge. Summary Vaginal delivery means that you will give birth by pushing your baby out of your birth canal (vagina). Your health care team will monitor you and your baby throughout the stages of labor. After you deliver your baby, your health care team will continue to assess you and your baby to ensure you are both recovering as expected after delivery. This  information is not intended to replace advice given to you by your health care provider. Make sure you discuss any questions you have with your health care provider. Document Revised: 07/07/2020 Document Reviewed: 07/07/2020 Elsevier Patient Education  2023 [employer]  

## 2022-05-20 NOTE — Progress Notes (Signed)
Subjective:  Tina Phillips is a 31 y.o. G1P0 at [redacted]w[redacted]d being seen today for ongoing prenatal care.  She is currently monitored for the following issues for this high-risk pregnancy and has Migraine without aura; Supervision of high risk pregnancy, antepartum; History of maternal deep vein thrombosis (DVT); Anemia; Gestational diabetes mellitus (GDM), antepartum; and Anticoagulant long-term use on their problem list.  Patient reports general discomforts of pregnancy.  Contractions: Not present. Vag. Bleeding: None.  Movement: Present. Denies leaking of fluid.   The following portions of the patient's history were reviewed and updated as appropriate: allergies, current medications, past family history, past medical history, past social history, past surgical history and problem list. Problem list updated.  Objective:   Vitals:   05/20/22 1507  BP: 100/69  Pulse: 90  Weight: 225 lb 1.6 oz (102.1 kg)    Fetal Status: Fetal Heart Rate (bpm): 147   Movement: Present     General:  Alert, oriented and cooperative. Patient is in no acute distress.  Skin: Skin is warm and dry. No rash noted.   Cardiovascular: Normal heart rate noted  Respiratory: Normal respiratory effort, no problems with respiration noted  Abdomen: Soft, gravid, appropriate for gestational age. Pain/Pressure: Absent     Pelvic:  Cervical exam deferred        Extremities: Normal range of motion.  Edema: Trace  Mental Status: Normal mood and affect. Normal behavior. Normal judgment and thought content.   Urinalysis:      Assessment and Plan:  Pregnancy: G1P0 at [redacted]w[redacted]d  1. Supervision of high risk pregnancy, antepartum Labor precautions  2. Diet controlled gestational diabetes mellitus (GDM), antepartum CBG's in goal range Serial growth scans and antenatal testing as per protocol IOL scheduled  3. Anticoagulant long-term use Pt has stopped SQ heparin due to discomfort of injections Risk of DVT reviewed with pt  4.  History of maternal deep vein thrombosis (DVT) As above  Term labor symptoms and general obstetric precautions including but not limited to vaginal bleeding, contractions, leaking of fluid and fetal movement were reviewed in detail with the patient. Please refer to After Visit Summary for other counseling recommendations.  No follow-ups on file.   Chancy Milroy, MD

## 2022-05-27 ENCOUNTER — Encounter: Payer: Self-pay | Admitting: Obstetrics and Gynecology

## 2022-05-27 ENCOUNTER — Ambulatory Visit (INDEPENDENT_AMBULATORY_CARE_PROVIDER_SITE_OTHER): Payer: 59 | Admitting: Obstetrics and Gynecology

## 2022-05-27 ENCOUNTER — Ambulatory Visit (INDEPENDENT_AMBULATORY_CARE_PROVIDER_SITE_OTHER): Payer: 59

## 2022-05-27 ENCOUNTER — Ambulatory Visit: Payer: 59 | Admitting: *Deleted

## 2022-05-27 VITALS — BP 106/74 | HR 94 | Wt 226.6 lb

## 2022-05-27 DIAGNOSIS — O480 Post-term pregnancy: Secondary | ICD-10-CM

## 2022-05-27 DIAGNOSIS — O0990 Supervision of high risk pregnancy, unspecified, unspecified trimester: Secondary | ICD-10-CM

## 2022-05-27 DIAGNOSIS — Z86718 Personal history of other venous thrombosis and embolism: Secondary | ICD-10-CM

## 2022-05-27 DIAGNOSIS — O24410 Gestational diabetes mellitus in pregnancy, diet controlled: Secondary | ICD-10-CM

## 2022-05-27 DIAGNOSIS — Z3A40 40 weeks gestation of pregnancy: Secondary | ICD-10-CM

## 2022-05-27 DIAGNOSIS — Z8759 Personal history of other complications of pregnancy, childbirth and the puerperium: Secondary | ICD-10-CM

## 2022-05-27 NOTE — Progress Notes (Signed)
Subjective:  Tina Phillips is a 31 y.o. G1P0 at [redacted]w[redacted]d being seen today for ongoing prenatal care.  She is currently monitored for the following issues for this high-risk pregnancy and has Migraine without aura; Supervision of high risk pregnancy, antepartum; History of maternal deep vein thrombosis (DVT); Anemia; Gestational diabetes mellitus (GDM), antepartum; and Anticoagulant long-term use on their problem list.  Patient reports general discomforts of precautions.  Contractions: Irregular. Vag. Bleeding: None.  Movement: Present. Denies leaking of fluid.   The following portions of the patient's history were reviewed and updated as appropriate: allergies, current medications, past family history, past medical history, past social history, past surgical history and problem list. Problem list updated.  Objective:   Vitals:   05/27/22 1513  BP: 106/74  Pulse: 94  Weight: 226 lb 9.6 oz (102.8 kg)    Fetal Status: Fetal Heart Rate (bpm): NST   Movement: Present     General:  Alert, oriented and cooperative. Patient is in no acute distress.  Skin: Skin is warm and dry. No rash noted.   Cardiovascular: Normal heart rate noted  Respiratory: Normal respiratory effort, no problems with respiration noted  Abdomen: Soft, gravid, appropriate for gestational age. Pain/Pressure: Present     Pelvic:  Cervical exam performed        Extremities: Normal range of motion.     Mental Status: Normal mood and affect. Normal behavior. Normal judgment and thought content.   Urinalysis:      Assessment and Plan:  Pregnancy: G1P0 at [redacted]w[redacted]d  1. Supervision of high risk pregnancy, antepartum Labor precautions IOL Saturday  2. Diet controlled gestational diabetes mellitus (GDM), antepartum CBG's controlled IOL as noted above  3. Post term pregnancy, antepartum condition or complication NST/BPP today   H/O DVT Pt not taking Heparin Term labor symptoms and general obstetric precautions including but  not limited to vaginal bleeding, contractions, leaking of fluid and fetal movement were reviewed in detail with the patient. Please refer to After Visit Summary for other counseling recommendations.  No follow-ups on file.   Chancy Milroy, MD

## 2022-05-27 NOTE — Patient Instructions (Signed)
Vaginal Delivery  Vaginal delivery means that you give birth by pushing your baby out of your birth canal (vagina). Your health care team will help you before, during, and after vaginal delivery. Birth experiences are unique for every woman and every pregnancy, and birth experiences vary depending on where you choose to give birth. What are the risks and benefits? Generally, this is safe. However, problems may occur, including: Bleeding. Infection. Damage to other structures such as vaginal tearing. Allergic reactions to medicines. Despite the risks, benefits of vaginal delivery include less risk of bleeding and infection and a shorter recovery time compared to a Cesarean delivery. Cesarean delivery, or C-section, is the surgical delivery of a baby. What happens when I arrive at the birth center or hospital? Once you are in labor and have been admitted into the hospital or birth center, your health care team may: Review your pregnancy history and any concerns that you have. Talk with you about your birth plan and discuss pain control options. Check your blood pressure, breathing, and heartbeat. Assess your baby's heartbeat. Monitor your uterus for contractions. Check whether your bag of water (amniotic sac) has broken (ruptured). Insert an IV into one of your veins. This may be used to give you fluids and medicines. Monitoring Your health care team may assess your contractions (uterine monitoring) and your baby's heart rate (fetal monitoring). You may need to be monitored: Often, but not continuously (intermittently). All the time or for long periods at a time (continuously). Continuous monitoring may be needed if: You are taking certain medicines, such as medicine to relieve pain or make your contractions stronger. You have pregnancy or labor complications. Monitoring may be done by: Placing a special stethoscope or a handheld monitoring device on your abdomen to check your baby's  heartbeat and to check for contractions. Placing monitors on your abdomen (external monitors) to record your baby's heartbeat and the frequency and length of contractions. Placing monitors inside your uterus through your vagina (internal monitors) to record your baby's heartbeat and the frequency, length, and strength of your contractions. Depending on the type of monitor, it may remain in your uterus or on your baby's head until birth. Telemetry. This is a type of continuous monitoring that can be done with external or internal monitors. Instead of having to stay in bed, you are able to move around. Physical exam Your health care team may perform frequent physical exams. This may include: Checking how and where your baby is positioned in your uterus. Checking your cervix to determine: Whether it is thinning out (effacing). Whether it is opening up (dilating). What happens during labor and delivery?  Normal labor and delivery is divided into the following three stages: Stage 1 This is the longest stage of labor. Throughout this stage, you will feel contractions. Contractions generally feel mild, infrequent, and irregular at first. They get stronger, more frequent, and more regular as you move through this stage. You may have contractions about every 2-3 minutes. This stage ends when your cervix is completely dilated to 4 inches (10 cm) and completely effaced. Stage 2 This stage starts once your cervix is completely effaced and dilated and lasts until the delivery of your baby. This is the stage where you will feel an urge to push your baby out of your vagina. You may feel stretching and burning pain, especially when the widest part of your baby's head passes through the vaginal opening (crowning). Once your baby is delivered, the umbilical cord will be   clamped and cut. Timing of cutting the cord will depend on your wishes, your baby's health, and your health care provider's practices. Your baby  will be placed on your bare chest (skin-to-skin contact) in an upright position and covered with a warm blanket. If you are choosing to breastfeed, watch your baby for feeding cues, like rooting or sucking, and help the baby to your breast for his or her first feeding. Stage 3 This stage starts immediately after the birth of your baby and ends after you deliver the placenta. This stage may take anywhere from 5 to 30 minutes. After your baby has been delivered, you will feel contractions as your body expels the placenta. These contractions also help your uterus get smaller and reduce bleeding. What can I expect after labor and delivery? After labor is over, you and your baby will be assessed closely until you are ready to go home. Your health care team will teach you how to care for yourself and your baby. You and your baby may be encouraged to stay in the same room (rooming in) during your hospital stay. This will help promote early bonding and successful breastfeeding. Your uterus will be checked and massaged regularly (fundal massage). You may continue to receive fluids and medicines through an IV. You will have some soreness and pain in your abdomen, vagina, and the area of skin between your vaginal opening and your anus (perineum). If an incision was made near your vagina (episiotomy) or if you had some vaginal tearing during delivery, cold compresses may be placed on your episiotomy or your tear. This helps to reduce pain and swelling. It is normal to have vaginal bleeding after delivery. Wear a sanitary pad for vaginal bleeding and discharge. Summary Vaginal delivery means that you will give birth by pushing your baby out of your birth canal (vagina). Your health care team will monitor you and your baby throughout the stages of labor. After you deliver your baby, your health care team will continue to assess you and your baby to ensure you are both recovering as expected after delivery. This  information is not intended to replace advice given to you by your health care provider. Make sure you discuss any questions you have with your health care provider. Document Revised: 07/07/2020 Document Reviewed: 07/07/2020 Elsevier Patient Education  2023 [employer]  

## 2022-05-28 ENCOUNTER — Telehealth (HOSPITAL_COMMUNITY): Payer: Self-pay | Admitting: *Deleted

## 2022-05-28 ENCOUNTER — Encounter (HOSPITAL_COMMUNITY): Payer: Self-pay | Admitting: *Deleted

## 2022-05-28 NOTE — Telephone Encounter (Signed)
Preadmission screen  

## 2022-05-29 ENCOUNTER — Inpatient Hospital Stay (HOSPITAL_COMMUNITY): Payer: 59

## 2022-05-30 ENCOUNTER — Inpatient Hospital Stay (HOSPITAL_COMMUNITY)
Admission: AD | Admit: 2022-05-30 | Discharge: 2022-06-02 | DRG: 788 | Disposition: A | Discharge status: Home / Self Care | Payer: 59 | Attending: Family Medicine | Admitting: Family Medicine

## 2022-05-30 ENCOUNTER — Other Ambulatory Visit: Payer: Self-pay

## 2022-05-30 ENCOUNTER — Encounter (HOSPITAL_COMMUNITY): Payer: Self-pay | Admitting: Obstetrics and Gynecology

## 2022-05-30 DIAGNOSIS — Z8616 Personal history of COVID-19: Secondary | ICD-10-CM | POA: Diagnosis not present

## 2022-05-30 DIAGNOSIS — O24429 Gestational diabetes mellitus in childbirth, unspecified control: Secondary | ICD-10-CM | POA: Diagnosis not present

## 2022-05-30 DIAGNOSIS — O24419 Gestational diabetes mellitus in pregnancy, unspecified control: Secondary | ICD-10-CM | POA: Diagnosis present

## 2022-05-30 DIAGNOSIS — O24420 Gestational diabetes mellitus in childbirth, diet controlled: Principal | ICD-10-CM | POA: Diagnosis present

## 2022-05-30 DIAGNOSIS — Z3A40 40 weeks gestation of pregnancy: Secondary | ICD-10-CM

## 2022-05-30 DIAGNOSIS — O9902 Anemia complicating childbirth: Secondary | ICD-10-CM | POA: Diagnosis present

## 2022-05-30 DIAGNOSIS — O480 Post-term pregnancy: Secondary | ICD-10-CM | POA: Diagnosis present

## 2022-05-30 DIAGNOSIS — Z7901 Long term (current) use of anticoagulants: Secondary | ICD-10-CM

## 2022-05-30 DIAGNOSIS — O24410 Gestational diabetes mellitus in pregnancy, diet controlled: Secondary | ICD-10-CM

## 2022-05-30 DIAGNOSIS — Z86718 Personal history of other venous thrombosis and embolism: Secondary | ICD-10-CM

## 2022-05-30 DIAGNOSIS — O0990 Supervision of high risk pregnancy, unspecified, unspecified trimester: Secondary | ICD-10-CM

## 2022-05-30 HISTORY — DX: Post-term pregnancy: O48.0

## 2022-05-30 LAB — CBC
HCT: 39.6 % (ref 36.0–46.0)
Hemoglobin: 12.5 g/dL (ref 12.0–15.0)
MCH: 28.9 pg (ref 26.0–34.0)
MCHC: 31.6 g/dL (ref 30.0–36.0)
MCV: 91.7 fL (ref 80.0–100.0)
Platelets: 278 10*3/uL (ref 150–400)
RBC: 4.32 MIL/uL (ref 3.87–5.11)
RDW: 14.6 % (ref 11.5–15.5)
WBC: 9.7 10*3/uL (ref 4.0–10.5)
nRBC: 0 % (ref 0.0–0.2)

## 2022-05-30 LAB — TYPE AND SCREEN
ABO/RH(D): A POS
Antibody Screen: NEGATIVE

## 2022-05-30 LAB — GLUCOSE, CAPILLARY: Glucose-Capillary: 117 mg/dL — ABNORMAL HIGH (ref 70–99)

## 2022-05-30 MED ORDER — TERBUTALINE SULFATE 1 MG/ML IJ SOLN: 0.2500 mg | Freq: Once | INTRAMUSCULAR | Status: DC | PRN

## 2022-05-30 MED ORDER — OXYCODONE-ACETAMINOPHEN 5-325 MG PO TABS: 1.0000 | ORAL_TABLET | ORAL | Status: DC | PRN

## 2022-05-30 MED ORDER — SOD CITRATE-CITRIC ACID 500-334 MG/5ML PO SOLN
30.0000 mL | ORAL | Status: DC | PRN
  Filled 2022-05-30: qty 30

## 2022-05-30 MED ORDER — FENTANYL CITRATE (PF) 100 MCG/2ML IJ SOLN
50.0000 ug | INTRAMUSCULAR | Status: DC | PRN
  Administered 2022-05-30 (×4): 100 ug via INTRAVENOUS
  Filled 2022-05-30 (×4): qty 2

## 2022-05-30 MED ORDER — LIDOCAINE HCL (PF) 1 % IJ SOLN: 30.0000 mL | INTRAMUSCULAR | Status: DC | PRN

## 2022-05-30 MED ORDER — OXYTOCIN-SODIUM CHLORIDE 30-0.9 UT/500ML-% IV SOLN
1.0000 m[IU]/min | INTRAVENOUS | Status: DC
  Administered 2022-05-30 – 2022-05-31 (×2): 2 m[IU]/min via INTRAVENOUS

## 2022-05-30 MED ORDER — ONDANSETRON HCL 4 MG/2ML IJ SOLN: 4.0000 mg | Freq: Four times a day (QID) | INTRAMUSCULAR | Status: DC | PRN

## 2022-05-30 MED ORDER — OXYTOCIN BOLUS FROM INFUSION: 333.0000 mL | Freq: Once | INTRAVENOUS | Status: DC

## 2022-05-30 MED ORDER — ACETAMINOPHEN 325 MG PO TABS
650.0000 mg | ORAL_TABLET | ORAL | Status: DC | PRN
  Administered 2022-05-31 (×2): 650 mg via ORAL
  Filled 2022-05-30 (×2): qty 2

## 2022-05-30 MED ORDER — MISOPROSTOL 50MCG HALF TABLET
50.0000 ug | ORAL_TABLET | Freq: Once | ORAL | Status: AC
  Administered 2022-05-30: 50 ug via ORAL
  Filled 2022-05-30: qty 1

## 2022-05-30 MED ORDER — OXYTOCIN-SODIUM CHLORIDE 30-0.9 UT/500ML-% IV SOLN
2.5000 [IU]/h | INTRAVENOUS | Status: DC
  Filled 2022-05-30: qty 500

## 2022-05-30 MED ORDER — LACTATED RINGERS IV SOLN: INTRAVENOUS | Status: DC

## 2022-05-30 MED ORDER — MISOPROSTOL 25 MCG QUARTER TABLET
25.0000 ug | ORAL_TABLET | Freq: Once | ORAL | Status: AC
  Administered 2022-05-30: 25 ug via VAGINAL
  Filled 2022-05-30: qty 1

## 2022-05-30 MED ORDER — LACTATED RINGERS IV SOLN: 500.0000 mL | INTRAVENOUS | Status: DC | PRN

## 2022-05-30 MED ORDER — OXYCODONE-ACETAMINOPHEN 5-325 MG PO TABS: 2.0000 | ORAL_TABLET | ORAL | Status: DC | PRN

## 2022-05-30 NOTE — H&P (Cosign Needed Addendum)
OBSTETRIC ADMISSION HISTORY AND PHYSICAL  Tina Phillips is a 31 y.o. female G1P0 with IUP at 64w4dpresenting for IOL. She reports +FMs. No LOF, VB, blurry vision, headaches, peripheral edema, or RUQ pain. She plans on breastfeeding. She requests condoms for birth control.  Dating: By LMP --->  Estimated Date of Delivery: 05/26/22  Sono:  05/13/22  @[redacted]w[redacted]d , normal anatomy, cephalic presentation, 31610R 79%ile, EFW 7lb 15 oz   Prenatal History/Complications: GDMA1 + DVT (historical)  Past Medical History: Past Medical History:  Diagnosis Date   Anemia    COVID-19 affecting pregnancy in third trimester 02/25/2022   Dx 02/25/2022   DV[place]) [place] [place]   Right posterior tibial vein DVT in 2019 while on oral contraceptives   Gestational diabetes    Ventral hernia     Past Surgical History: Past Surgical History:  Procedure Laterality Date   INSERTION OF MESH N/A 12/30/2017   Procedure: INSERTION OF MESH;  Surgeon: CClovis Riley MD;  [facility]  Service: General;  Laterality: N/A;   POLYPECTOMY     VENTRAL HERNIA REPAIR N/A 12/30/2017   Procedure: LAPAROSCOPIC VENTRAL HERNIA WITH MESH;  Surgeon: CClovis Riley MD;  Location: [facility];  Service: General;  Laterality: N/A;   WISDOM TOOTH EXTRACTION      Obstetrical History: OB History     Gravida  1   Para      Term      Preterm      AB      Living         SAB      IAB      Ectopic      Multiple      Live Births              Social History: Social History   Socioeconomic History   Marital status: Single    Spouse name: Not on file   Number of children: Not on file   Years of education: Not on file   Highest education level: Not on file  Occupational History   Occupation: retail    Employer: [employer]  Tobacco Use   Smoking status: Never   Smokeless tobacco: Never  Vaping Use   Vaping Use: Never used  Substance and Sexual Activity   Alcohol use: No   Drug use: No    Sexual activity: Yes    Partners: Male  Other Topics Concern   Not on file  Social History Narrative   Lives with mom, dad, brother and sister   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 - wants to transfer to [employer]- wants to be an event planner   Works at [employer], research (medical)  Social Determinants of HRadio broadcast assistantStrain: Not on file  Food Insecurity: No Food Insecurity (05/27/2022)   Hunger Vital Sign    Worried About Running Out of Food in the Last Year: Never true    RElkhornin the Last Year: Never true  Transportation Needs: No Transportation Needs (05/27/2022)   PRAPARE - THydrologist(Medical): No    Lack of Transportation (Non-Medical): No  Physical Activity: Not on file  Stress: Not on file  Social Connections: Not on file    Family History: Family History  Problem Relation Age of Onset   Diabetes Mother    Deep vein thrombosis Mother    Congestive Heart Failure Mother  Diabetes Father    Hyperlipidemia Father    Post-traumatic stress disorder Father    Prostate cancer Father    Deep vein thrombosis Father    Cancer - Prostate Father     Allergies: No Known Allergies  Medications Prior to Admission  Medication Sig Dispense Refill Last Dose   Accu-Chek Softclix Lancets lancets Use as instructed 100 each 12    aspirin EC 81 MG tablet Take 1 tablet (81 mg total) by mouth daily. Take after 12 weeks for prevention of preeclampsia later in pregnancy (Patient not taking: Reported on 05/27/2022) 300 tablet 2    Blood Glucose Monitoring Suppl (ACCU-CHEK GUIDE) w/Device KIT 1 Device by Does not apply route as needed. 1 kit 0    ferrous sulfate 325 (65 FE) MG EC tablet Take 325 mg by mouth 3 (three) times daily with meals. (Patient not taking: Reported on 05/27/2022)      glucose blood (ACCU-CHEK GUIDE) test strip Use as instructed 100 each 12    Insulin Syringe-Needle U-100 31G X 15/64" 1 ML MISC 1 Syringe by Does not apply route in the  morning and at bedtime. (Patient not taking: Reported on 05/27/2022) 30 each 0    Prenatal Vit-Fe Fumarate-FA (PRENATAL 1+1 PO) Prenatal 1        Review of Systems:  All systems reviewed and negative except as stated in HPI  PE: Blood pressure 117/69, pulse 88, temperature 98.4 F (36.9 C), temperature source Oral, resp. rate 18, height 5' 7"  (1.702 m), weight 104.7 kg, last menstrual period 08/19/2021. General appearance: alert and cooperative Lungs: regular rate and effort Heart: regular rate  Abdomen: soft, non-tender Extremities: Homans sign is negative, no sign of DVT Presentation: cephalic EFM: 277 bpm, mild variability, yes accels, no decels Toco: q2-4 m Dilation: 1 Effacement (%): 50 Station: Ballotable Exam by:: Glade Nurse, RNC SVE: NA  Prenatal labs: ABO, Rh: --/--/A POS (10/08 1052) Antibody: NEG (10/08 1052) Rubella: Immune (03/10 0000) RPR: Non Reactive (07/18 0844)  HBsAg: Negative (03/10 0000)  HIV: Non Reactive (07/18 0844)  GBS: Negative/-- (09/13 1705)  2 hr GTT abnormal  Prenatal Transfer Tool  Maternal Diabetes: Yes:  Diabetes Type:  Diet controlled Genetic Screening: Normal Maternal Ultrasounds/Referrals: Normal Fetal Ultrasounds or other Referrals:  None Maternal Substance Abuse:  No Significant Maternal Medications:  None Significant Maternal Lab Results: Group B Strep negative  Results for orders placed or performed during the hospital encounter of 05/30/22 (from the past 24 hour(s))  CBC   Collection Time: 05/30/22 10:52 AM  Result Value Ref Range   WBC 9.7 4.0 - 10.5 K/uL   RBC 4.32 3.87 - 5.11 MIL/uL   Hemoglobin 12.5 12.0 - 15.0 g/dL   HCT 39.6 36.0 - 46.0 %   MCV 91.7 80.0 - 100.0 fL   MCH 28.9 26.0 - 34.0 pg   MCHC 31.6 30.0 - 36.0 g/dL   RDW 14.6 11.5 - 15.5 %   Platelets 278 150 - 400 K/uL   nRBC 0.0 0.0 - 0.2 %  Type and screen   Collection Time: 05/30/22 10:52 AM  Result Value Ref Range   ABO/RH(D) A POS    Antibody  Screen NEG    Sample Expiration      06/02/2022,2359 Performed at [facility], [place] [address], [place], [place] [address]   Glucose, capillary   Collection Time: 05/30/22 11:47 AM  Result Value Ref Range   Glucose-Capillary 117 (H) 70 - 99 mg/dL  Patient Active Problem List   Diagnosis Date Noted   Post-dates pregnancy 05/30/2022   Anticoagulant long-term use 04/19/2022   Gestational diabetes mellitus (GDM), antepartum 03/16/2022   Supervision of high risk pregnancy, antepartum 12/30/2021   History of maternal deep vein thrombosis (DVT) 12/30/2021   Anemia 12/30/2021   Migraine without aura 07/18/2015    Assessment: Tina Phillips is a 31 y.o. G1P0 at 66w4dhere for IOL for h/r pregnancy in mother with h/o DVT and GDMA1 and long term use of anticoagulation  1. Labor: Induction; Cytotec given. Will check q4 hours until active.  2. FWB: Cat 1 3. Pain: Breathing through contractions, IV pain meds, epidural, and family support all options for this patient 4. GBS: Negative   Plan: Admit to L&D  CDeloria Lair DO  05/30/2022, 1:28 PM  GME ATTESTATION:  I saw and evaluated the patient. I agree with the findings and the plan of care as documented in the resident's note. I have made changes to documentation as necessary.  SGerlene Fee DO OB Fellow, F[facility] Centerfor [facility]10/03/2022, 8:17 PM

## 2022-05-30 NOTE — Anesthesia Preprocedure Evaluation (Signed)
Anesthesia Evaluation  Patient identified by MRN, date of birth, ID band Patient awake    Reviewed: Allergy & Precautions, NPO status , Patient's Chart, lab work & pertinent test results  Airway Mallampati: II  TM Distance: >3 FB Neck ROM: Full    Dental no notable dental hx. (+) Teeth Intact   Pulmonary neg pulmonary ROS,    Pulmonary exam normal breath sounds clear to auscultation       Cardiovascular + DVT (hx of)  Normal cardiovascular exam Rhythm:Regular Rate:Normal     Neuro/Psych  Headaches, negative psych ROS   GI/Hepatic negative GI ROS, Neg liver ROS,   Endo/Other  diabetes, Gestational  Renal/GU      Musculoskeletal   Abdominal (+) + obese (BMI 36.2),   Peds  Hematology Lab Results      Component                Value               Date                      WBC                      9.7                 05/30/2022                HGB                      12.5                05/30/2022                HCT                      39.6                05/30/2022                MCV                      91.7                05/30/2022                PLT                      278                 05/30/2022              Anesthesia Other Findings   Reproductive/Obstetrics (+) Pregnancy                            Anesthesia Physical Anesthesia Plan  ASA: 3  Anesthesia Plan: Epidural   Post-op Pain Management:    Induction:   PONV Risk Score and Plan:   Airway Management Planned:   Additional Equipment:   Intra-op Plan:   Post-operative Plan:   Informed Consent: I have reviewed the patients History and Physical, chart, labs and discussed the procedure including the risks, benefits and alternatives for the proposed anesthesia with the patient or authorized representative who has indicated his/her understanding and acceptance.       Plan Discussed with:   Anesthesia Plan  Comments: (  40.5  Wk Primagravida w gDm and hx of Dvt for LEA)       Anesthesia Quick Evaluation

## 2022-05-30 NOTE — Progress Notes (Signed)
Labor Progress Note Tina Phillips is a 31 y.o. G1P0 at [redacted]w[redacted]d presented for IOL for GDMA1 S: Breathing through contractions and feeling them. She is still moving around the room and walking the halls with husband.  O:  BP 110/72   Pulse 77   Temp 98.4 F (36.9 C) (Oral)   Resp 18   Ht 5\' 7"  (1.702 m)   Wt 104.7 kg   LMP 08/19/2021 (Exact Date)   BMI 36.15 kg/m  EFM: 137bpm/Moderate variability/ 15x15 accels/ None decels   CVE: Dilation: 3 Effacement (%): 60 Station: -3 Presentation: Vertex Exam by:: Chantelle Verdi, DO   A&P: 31 y.o. G1P0 [redacted]w[redacted]d IOL #Labor: Progressing well. Cervix progressed to 3 since last check. Pitocin to be initiated 2x2. #Pain: Breathable contractions, IV pain meds, epidural available. Family support at bedside #FWB: CAT 1  #GBS negative #GDMA1: Diet controlled  Deloria Lair, DO 7:56 PM

## 2022-05-30 NOTE — Progress Notes (Addendum)
LABOR PROGRESS NOTE  Tina Phillips is a 31 y.o. G1P0 at [redacted]w[redacted]d  admitted for IOL for [place].   Subjective: Patient reports feeling very tired. She does not say much, but denies pain.   Objective: BP 110/72   Pulse 78   Temp 98.4 F (36.9 C) (Oral)   Resp 18   Ht 5\' 7"  (1.702 m)   Wt 104.7 kg   LMP 08/19/2021 (Exact Date)   BMI 36.15 kg/m  or  Vitals:   05/30/22 1032 05/30/22 1406 05/30/22 1756 05/30/22 2028  BP: 117/69 124/80 110/72 110/72  Pulse: 88 79 77 78  Resp: 18 18 18    Temp: 98.4 F (36.9 C) 98.4 F (36.9 C)    TempSrc: Oral Oral    Weight: 104.7 kg     Height: 5\' 7"  (1.702 m)       SVE Dilation: 3 Effacement (%): 60 Station: -3 Presentation: Vertex Exam by:: Lockman, DO FHT: baseline rate 135, moderate varibility, +acel, -decel Toco: q2-3 minutes   Labs: Lab Results  Component Value Date   WBC 9.7 05/30/2022   HGB 12.5 05/30/2022   HCT 39.6 05/30/2022   MCV 91.7 05/30/2022   PLT 278 05/30/2022    Patient Active Problem List   Diagnosis Date Noted   Post-dates pregnancy 05/30/2022   Anticoagulant long-term use 04/19/2022   Gestational diabetes mellitus (GDM), antepartum 03/16/2022   Supervision of high risk pregnancy, antepartum 12/30/2021   History of maternal deep vein thrombosis (DVT) 12/30/2021   Anemia 12/30/2021   Migraine without aura 07/18/2015    Assessment / Plan: 31 y.o. G1P0 at [redacted]w[redacted]d here for IOL s/t GDMA1 Patient has very flat affect, F/u and continue to provide support.  Labor: pitocin, continue to monitor, plan to AROM at next check if possible  Fetal Wellbeing:  Cat 1  Pain Control:  No analgesia currently, desires epidural  Anticipated MOD:  SVD  Lowry Ram, MD  PGY-1, [facility]  05/30/2022, 9:52 PM

## 2022-05-30 NOTE — Progress Notes (Signed)
Labor Progress Note Tina Phillips is a 31 y.o. G1P0 at [redacted]w[redacted]d presented for IOL  S: Comfortable and hungry   O:  BP 124/80   Pulse 79   Temp 98.4 F (36.9 C) (Oral)   Resp 18   Ht 5\' 7"  (1.702 m)   Wt 104.7 kg   LMP 08/19/2021 (Exact Date)   BMI 36.15 kg/m  EFM: 141bpm/Moderate variability/ 15x15 accels/ None decels   CVE: Dilation: 1 Effacement (%): 50 Station: Ballotable Presentation: Vertex Exam by:: Glade Nurse, RNC   A&P: 31 y.o. G1P0 [redacted]w[redacted]d IOL #Labor: Progressing well. Foley Bulb at next check if able. Encouraged to continue movement while on L&D prior to epiudural  #Pain: Breathing through contractions; Family support #FWB: Cat 1 #GBS negative #H/o DVT: Not on anticoagulation; will give 6weeks anticoag postpartum for prophylaxis #GDMA1: 117 nonfasting; controlled with diet. EFW 65%  (chronic/other problems)  Deloria Lair, DO 4:57 PM

## 2022-05-31 ENCOUNTER — Inpatient Hospital Stay (HOSPITAL_COMMUNITY): Payer: 59 | Admitting: Anesthesiology

## 2022-05-31 ENCOUNTER — Encounter (HOSPITAL_COMMUNITY)
Admission: AD | Disposition: A | Discharge status: Home / Self Care | Payer: Self-pay | Source: Home / Self Care | Attending: Family Medicine

## 2022-05-31 ENCOUNTER — Other Ambulatory Visit: Payer: Self-pay

## 2022-05-31 ENCOUNTER — Encounter (HOSPITAL_COMMUNITY): Payer: Self-pay | Admitting: Obstetrics and Gynecology

## 2022-05-31 DIAGNOSIS — O24429 Gestational diabetes mellitus in childbirth, unspecified control: Secondary | ICD-10-CM

## 2022-05-31 DIAGNOSIS — Z3A40 40 weeks gestation of pregnancy: Secondary | ICD-10-CM

## 2022-05-31 DIAGNOSIS — O24420 Gestational diabetes mellitus in childbirth, diet controlled: Secondary | ICD-10-CM

## 2022-05-31 DIAGNOSIS — O480 Post-term pregnancy: Secondary | ICD-10-CM

## 2022-05-31 LAB — RPR: RPR Ser Ql: NONREACTIVE

## 2022-05-31 LAB — GLUCOSE, CAPILLARY: Glucose-Capillary: 107 mg/dL — ABNORMAL HIGH (ref 70–99)

## 2022-05-31 SURGERY — Surgical Case
Anesthesia: Epidural

## 2022-05-31 MED ORDER — SIMETHICONE 80 MG PO CHEW: 80.0000 mg | CHEWABLE_TABLET | ORAL | Status: DC | PRN

## 2022-05-31 MED ORDER — LACTATED RINGERS IV SOLN: 500.0000 mL | Freq: Once | INTRAVENOUS | Status: DC

## 2022-05-31 MED ORDER — LIDOCAINE HCL (PF) 1 % IJ SOLN
INTRAMUSCULAR | Status: DC | PRN
  Administered 2022-05-31: 5 mL via EPIDURAL

## 2022-05-31 MED ORDER — EPHEDRINE 5 MG/ML INJ: 10.0000 mg | INTRAVENOUS | Status: DC | PRN

## 2022-05-31 MED ORDER — MORPHINE SULFATE (PF) 0.5 MG/ML IJ SOLN
INTRAMUSCULAR | Status: DC | PRN
  Administered 2022-05-31: 3 mg via EPIDURAL

## 2022-05-31 MED ORDER — PHENYLEPHRINE 80 MCG/ML (10ML) SYRINGE FOR IV PUSH (FOR BLOOD PRESSURE SUPPORT)
80.0000 ug | PREFILLED_SYRINGE | INTRAVENOUS | Status: DC | PRN
  Filled 2022-05-31: qty 10

## 2022-05-31 MED ORDER — PHENYLEPHRINE HCL-NACL 20-0.9 MG/250ML-% IV SOLN
INTRAVENOUS | Status: DC | PRN
  Administered 2022-05-31: 30 ug/min via INTRAVENOUS

## 2022-05-31 MED ORDER — IBUPROFEN 600 MG PO TABS
600.0000 mg | ORAL_TABLET | Freq: Four times a day (QID) | ORAL | Status: DC
  Administered 2022-06-01 – 2022-06-02 (×4): 600 mg via ORAL
  Filled 2022-05-31 (×4): qty 1

## 2022-05-31 MED ORDER — CEFAZOLIN SODIUM-DEXTROSE 2-4 GM/100ML-% IV SOLN: 2.0000 g | INTRAVENOUS | Status: DC

## 2022-05-31 MED ORDER — DIPHENHYDRAMINE HCL 50 MG/ML IJ SOLN: 12.5000 mg | INTRAMUSCULAR | Status: DC | PRN

## 2022-05-31 MED ORDER — OXYTOCIN-SODIUM CHLORIDE 30-0.9 UT/500ML-% IV SOLN
INTRAVENOUS | Status: DC | PRN
  Administered 2022-05-31 (×2): 30 [IU] via INTRAVENOUS

## 2022-05-31 MED ORDER — ONDANSETRON HCL 4 MG/2ML IJ SOLN
INTRAMUSCULAR | Status: AC
  Filled 2022-05-31: qty 2

## 2022-05-31 MED ORDER — FENTANYL CITRATE (PF) 100 MCG/2ML IJ SOLN
INTRAMUSCULAR | Status: DC | PRN
  Administered 2022-05-31: 100 ug via EPIDURAL

## 2022-05-31 MED ORDER — FENTANYL CITRATE (PF) 100 MCG/2ML IJ SOLN
INTRAMUSCULAR | Status: AC
  Filled 2022-05-31: qty 2

## 2022-05-31 MED ORDER — LIDOCAINE-EPINEPHRINE (PF) 2 %-1:200000 IJ SOLN
INTRAMUSCULAR | Status: DC | PRN
  Administered 2022-05-31: 10 mL via EPIDURAL

## 2022-05-31 MED ORDER — WITCH HAZEL-GLYCERIN EX PADS: 1.0000 | MEDICATED_PAD | CUTANEOUS | Status: DC | PRN

## 2022-05-31 MED ORDER — MENTHOL 3 MG MT LOZG: 1.0000 | LOZENGE | OROMUCOSAL | Status: DC | PRN

## 2022-05-31 MED ORDER — PRENATAL MULTIVITAMIN CH
1.0000 | ORAL_TABLET | Freq: Every day | ORAL | Status: DC
  Administered 2022-06-01 – 2022-06-02 (×2): 1 via ORAL
  Filled 2022-05-31 (×2): qty 1

## 2022-05-31 MED ORDER — SODIUM CHLORIDE 0.9 % IR SOLN
Status: DC | PRN
  Administered 2022-05-31: 1000 mL

## 2022-05-31 MED ORDER — TRANEXAMIC ACID-NACL 1000-0.7 MG/100ML-% IV SOLN
INTRAVENOUS | Status: DC | PRN
  Administered 2022-05-31: 1000 mg via INTRAVENOUS

## 2022-05-31 MED ORDER — FENTANYL-BUPIVACAINE-NACL 0.5-0.125-0.9 MG/250ML-% EP SOLN
12.0000 mL/h | EPIDURAL | Status: DC | PRN
  Filled 2022-05-31: qty 250

## 2022-05-31 MED ORDER — KETOROLAC TROMETHAMINE 30 MG/ML IJ SOLN
30.0000 mg | Freq: Four times a day (QID) | INTRAMUSCULAR | Status: AC
  Administered 2022-05-31 – 2022-06-01 (×4): 30 mg via INTRAVENOUS
  Filled 2022-05-31 (×4): qty 1

## 2022-05-31 MED ORDER — LACTATED RINGERS AMNIOINFUSION: INTRAVENOUS | Status: DC

## 2022-05-31 MED ORDER — ONDANSETRON HCL 4 MG/2ML IJ SOLN
INTRAMUSCULAR | Status: DC | PRN
  Administered 2022-05-31: 4 mg via INTRAVENOUS

## 2022-05-31 MED ORDER — CEFAZOLIN SODIUM-DEXTROSE 2-3 GM-%(50ML) IV SOLR
INTRAVENOUS | Status: DC | PRN
  Administered 2022-05-31: 2 g via INTRAVENOUS

## 2022-05-31 MED ORDER — PHENYLEPHRINE 80 MCG/ML (10ML) SYRINGE FOR IV PUSH (FOR BLOOD PRESSURE SUPPORT)
80.0000 ug | PREFILLED_SYRINGE | INTRAVENOUS | Status: DC | PRN
  Administered 2022-05-31: 80 ug via INTRAVENOUS

## 2022-05-31 MED ORDER — HYDROCODONE-ACETAMINOPHEN 5-325 MG PO TABS
1.0000 | ORAL_TABLET | ORAL | Status: DC | PRN
  Administered 2022-06-01 – 2022-06-02 (×4): 1 via ORAL
  Filled 2022-05-31 (×4): qty 1

## 2022-05-31 MED ORDER — STERILE WATER FOR IRRIGATION IR SOLN
Status: DC | PRN
  Administered 2022-05-31: 1000 mL

## 2022-05-31 MED ORDER — ONDANSETRON HCL 4 MG/2ML IJ SOLN
4.0000 mg | Freq: Three times a day (TID) | INTRAMUSCULAR | Status: DC | PRN
  Administered 2022-05-31: 4 mg via INTRAVENOUS

## 2022-05-31 MED ORDER — SIMETHICONE 80 MG PO CHEW
80.0000 mg | CHEWABLE_TABLET | Freq: Three times a day (TID) | ORAL | Status: DC
  Administered 2022-05-31 – 2022-06-02 (×4): 80 mg via ORAL
  Filled 2022-05-31 (×4): qty 1

## 2022-05-31 MED ORDER — LACTATED RINGERS IV SOLN: INTRAVENOUS | Status: DC

## 2022-05-31 MED ORDER — DIPHENHYDRAMINE HCL 25 MG PO CAPS
25.0000 mg | ORAL_CAPSULE | Freq: Four times a day (QID) | ORAL | Status: DC | PRN
  Administered 2022-05-31 – 2022-06-01 (×2): 25 mg via ORAL
  Filled 2022-05-31 (×2): qty 1

## 2022-05-31 MED ORDER — ENOXAPARIN SODIUM 40 MG/0.4ML IJ SOSY
40.0000 mg | PREFILLED_SYRINGE | INTRAMUSCULAR | Status: DC
  Administered 2022-06-01 – 2022-06-02 (×2): 40 mg via SUBCUTANEOUS
  Filled 2022-05-31 (×2): qty 0.4

## 2022-05-31 MED ORDER — DIBUCAINE (PERIANAL) 1 % EX OINT: 1.0000 | TOPICAL_OINTMENT | CUTANEOUS | Status: DC | PRN

## 2022-05-31 MED ORDER — OXYTOCIN-SODIUM CHLORIDE 30-0.9 UT/500ML-% IV SOLN
2.5000 [IU]/h | INTRAVENOUS | Status: AC
  Administered 2022-05-31: 2.5 [IU]/h via INTRAVENOUS

## 2022-05-31 MED ORDER — MORPHINE SULFATE (PF) 0.5 MG/ML IJ SOLN
INTRAMUSCULAR | Status: AC
  Filled 2022-05-31: qty 10

## 2022-05-31 MED ORDER — COCONUT OIL OIL: 1.0000 | TOPICAL_OIL | Status: DC | PRN

## 2022-05-31 MED ORDER — SENNOSIDES-DOCUSATE SODIUM 8.6-50 MG PO TABS
2.0000 | ORAL_TABLET | ORAL | Status: DC
  Administered 2022-05-31 – 2022-06-01 (×2): 2 via ORAL
  Filled 2022-05-31 (×2): qty 2

## 2022-05-31 MED ORDER — FENTANYL-BUPIVACAINE-NACL 0.5-0.125-0.9 MG/250ML-% EP SOLN
EPIDURAL | Status: DC | PRN
  Administered 2022-05-31: 12 mL/h via EPIDURAL

## 2022-05-31 MED ORDER — SOD CITRATE-CITRIC ACID 500-334 MG/5ML PO SOLN
30.0000 mL | ORAL | Status: AC
  Administered 2022-05-31: 30 mL via ORAL

## 2022-05-31 MED ORDER — DEXAMETHASONE SODIUM PHOSPHATE 10 MG/ML IJ SOLN
INTRAMUSCULAR | Status: DC | PRN
  Administered 2022-05-31: 10 mg via INTRAVENOUS

## 2022-05-31 MED ORDER — DEXAMETHASONE SODIUM PHOSPHATE 10 MG/ML IJ SOLN
INTRAMUSCULAR | Status: AC
  Filled 2022-05-31: qty 1

## 2022-05-31 MED ORDER — BUPIVACAINE HCL (PF) 0.25 % IJ SOLN
INTRAMUSCULAR | Status: DC | PRN
  Administered 2022-05-31: 6 mL via EPIDURAL

## 2022-05-31 MED ORDER — SODIUM CHLORIDE 0.9 % IV SOLN
500.0000 mg | INTRAVENOUS | Status: AC
  Administered 2022-05-31: 500 mg via INTRAVENOUS

## 2022-05-31 SURGICAL SUPPLY — 37 items
BENZOIN TINCTURE PRP APPL 2/3 (GAUZE/BANDAGES/DRESSINGS) IMPLANT
CHLORAPREP W/TINT 26ML (MISCELLANEOUS) ×2 IMPLANT
CLAMP UMBILICAL CORD (MISCELLANEOUS) ×1 IMPLANT
CLOTH BEACON ORANGE TIMEOUT ST (SAFETY) ×1 IMPLANT
DRSG OPSITE POSTOP 4X10 (GAUZE/BANDAGES/DRESSINGS) ×1 IMPLANT
ELECT REM PT RETURN 9FT ADLT (ELECTROSURGICAL) ×1
ELECTRODE REM PT RTRN 9FT ADLT (ELECTROSURGICAL) ×1 IMPLANT
EXTRACTOR VACUUM BELL STYLE (SUCTIONS) IMPLANT
GAUZE SPONGE 4X4 12PLY STRL LF (GAUZE/BANDAGES/DRESSINGS) IMPLANT
GLOVE BIOGEL PI IND STRL 7.0 (GLOVE) ×2 IMPLANT
GLOVE ECLIPSE 7.0 STRL STRAW (GLOVE) ×1 IMPLANT
GOWN STRL REUS W/TWL LRG LVL3 (GOWN DISPOSABLE) ×2 IMPLANT
HEMOSTAT ARISTA ABSORB 3G PWDR (HEMOSTASIS) IMPLANT
KIT ABG SYR 3ML LUER SLIP (SYRINGE) ×1 IMPLANT
MAT PREVALON FULL STRYKER (MISCELLANEOUS) IMPLANT
NDL HYPO 25X5/8 SAFETYGLIDE (NEEDLE) ×1 IMPLANT
NEEDLE HYPO 25X5/8 SAFETYGLIDE (NEEDLE) ×2 IMPLANT
NS IRRIG 1000ML POUR BTL (IV SOLUTION) ×1 IMPLANT
PACK C SECTION WH (CUSTOM PROCEDURE TRAY) ×1 IMPLANT
PAD ABD 7.5X8 STRL (GAUZE/BANDAGES/DRESSINGS) IMPLANT
PAD OB MATERNITY 4.3X12.25 (PERSONAL CARE ITEMS) ×1 IMPLANT
RTRCTR C-SECT PINK 25CM LRG (MISCELLANEOUS) ×1 IMPLANT
STRIP CLOSURE SKIN 1/2X4 (GAUZE/BANDAGES/DRESSINGS) IMPLANT
SUT MNCRL 0 VIOLET CTX 36 (SUTURE) ×2 IMPLANT
SUT MONOCRYL 0 CTX 36 (SUTURE) ×2
SUT PLAIN 0 NONE (SUTURE) IMPLANT
SUT PLAIN 2 0 (SUTURE)
SUT PLAIN 2 0 XLH (SUTURE) IMPLANT
SUT PLAIN ABS 2-0 CT1 27XMFL (SUTURE) IMPLANT
SUT VIC AB 0 CTX 36 (SUTURE) ×1
SUT VIC AB 0 CTX36XBRD ANBCTRL (SUTURE) ×1 IMPLANT
SUT VIC AB 2-0 CT1 27 (SUTURE)
SUT VIC AB 2-0 CT1 TAPERPNT 27 (SUTURE) IMPLANT
SUT VIC AB 4-0 KS 27 (SUTURE) ×1 IMPLANT
TOWEL OR 17X24 6PK STRL BLUE (TOWEL DISPOSABLE) ×1 IMPLANT
TRAY FOLEY W/BAG SLVR 14FR LF (SET/KITS/TRAYS/PACK) IMPLANT
WATER STERILE IRR 1000ML POUR (IV SOLUTION) ×1 IMPLANT

## 2022-05-31 NOTE — Progress Notes (Signed)
Came to bedside to discuss fetal status and labor progress to date with patient and her mother and husband who are at bedside.  We reviewed that within the last hour the strip has looked much better, with at least one accel and more consistent moderate variability, although some early to late decels are still present. Looking further back at the strip patient has had a few different periods of decreased variability that is minimal to moderate along with recurrent late decels. During that period of time since around 0200 her cervix has remained essentially unchanged from 7-8 cm.   Cervix was rechecked by Dr. Thompson Grayer and was found to be unchanged.  I offered the patient two choices. Given that fetal status is more reassuring right now and she is a primip, I think it very reasonable to try to resume pitocin to see if we can increase her contraction pattern and hopefully achieve a vaginal delivery. However, if we were to go this route we would have a very low tolerance for fetal stress and would proceed with cesarean if necessary. At the same time there have been signs of fetal stress, though at present baby still has good variability indicative of reserve, so if she does not want to continue to attempt a vaginal delivery any longer I think it is also reasonable to proceed with a cesarean delivery immediately.   After discussion with her family she elects to resume pitocin, again with the understanding that we will have a low tolerance for fetal distress. Instructed to start pitocin at 2u and increase 1x1.  Clarnce Flock, MD/MPH Attending Family Medicine Physician, [facility] for [facility], [facility]   05/31/22 11:06 AM

## 2022-05-31 NOTE — Transfer of Care (Signed)
Immediate Anesthesia Transfer of Care Note  Patient: Tina Phillips  Procedure(s) Performed: CESAREAN SECTION  Patient Location: PACU  Anesthesia Type:Epidural  Level of Consciousness: awake, alert , and patient cooperative  Airway & Oxygen Therapy: Patient Spontanous Breathing  Post-op Assessment: Report given to RN and Post -op Vital signs reviewed and stable  Post vital signs: Reviewed and stable  Last Vitals:  Vitals Value Taken Time  BP 108/68 05/31/22 1455  Temp    Pulse 113 05/31/22 1458  Resp 20 05/31/22 1458  SpO2 97 % 05/31/22 1458  Vitals shown include unvalidated device data.  Last Pain:  Vitals:   05/31/22 1331  TempSrc: Oral  PainSc: 0-No pain         Complications: No notable events documented.

## 2022-05-31 NOTE — Lactation Note (Addendum)
This note was copied from a baby's chart. Lactation Consultation Note  Patient Name: Girl Blondell Laperle [id number] Date: 05/31/2022 Reason for consult: Initial assessment;Difficult latch;Term Age:30 hours, C/S and GDM Birth Parent feeding choice is:  breast and supplementing with formula. LC changed a large stool while in room, infant had two stools since birth. Birth Parent  did reverse pressure softening to help evert nipple shaft out more and then  attempted to latch infant at the breast, infant only held nipple in mouth and would not suckle at the breast. [facility] taught Birth Parent hand expression using the breast model and Birth Parent expressed a few drops of colostrum that was spoon fed to infant. Afterwards infant was given 4 mls of formula, LC used yellow slow flow nipple in room, infant did not have coordinated suck and spillage of formula side of infant's mouth, LC switched to purple and clear nipple and infant was still not forming a good seal  but was taking formula better than yellow slow flow nipple. If infant doesn't improve with latching at the breast  or drinking from bottle, she may need SLP consult in the future. Birth Parent was tired and plans to rest, Support Person was doing STS with infant when [facility] left the room. Birth Parent was given a hand pump to pre-pump breast prior to latching infant at the breast and breast shells to wear in bra when not sleeping to help evert nipple shaft out more, see Birth Parent nipple type below in latch assessment. Mom made aware of O/P services, breastfeeding support groups, community resources, and our phone # for post-discharge questions.   Birth Parent feeding plan: 1- Birth Parent will continue to work towards latching infant at the breast, will pre-pump breast with hand pump prior to latching infant, continue to ask RN/LC for latch assistance and BF infant according to hunger cues, on demand, 8 to 12+ times within 24 hours, STS. 2- Birth Parent  knows she can hand express and give infant back EM by spoon if infant doesn't latch at the breast. 3- Continue to supplement infant with formula and ask RN for help with feeding if infant continue to refuse bottle or having spillage out the side of her mouth. 4- LC encouraged Birth Parents to continue to do lots of STS with infant.  Maternal Data Has patient been taught Hand Expression?: Yes Does the patient have breastfeeding experience prior to this delivery?: No  Feeding Mother's Current Feeding Choice: Breast Milk and Formula Nipple Type: Slow - flow  LATCH Score Latch: Too sleepy or reluctant, no latch achieved, no sucking elicited.  Audible Swallowing: None  Type of Nipple: Everted at rest and after stimulation (short shafted)  Comfort (Breast/Nipple): Soft / non-tender  Hold (Positioning): Assistance needed to correctly position infant at breast and maintain latch.  LATCH Score: 5   Lactation Tools Discussed/Used Tools: Pump;Shells Breast pump type: Manual Pump Education: Setup, frequency, and cleaning;Milk Storage Reason for Pumping: Pre-pump prior to latching infant at the breast, Birth Parent has short shafted nipples but responds well to stimulation. Pumping frequency: Birth Parent will pre-pump breast with hand pump prior to latching infant at the breast.  Interventions Interventions: Breast feeding basics reviewed;Assisted with latch;Skin to skin;Breast massage;Hand express;Pre-pump if needed;Shells;Expressed milk;Position options;Support pillows;Adjust position;Hand pump;Education;LC Services brochure  Discharge    Consult Status Consult Status: Follow-up Date: 06/01/22 Follow-up type: In-patient    Vicente Serene 05/31/2022, 9:21 PM

## 2022-05-31 NOTE — Anesthesia Procedure Notes (Addendum)
Epidural Patient location during procedure: OB Start time: 05/31/2022 12:32 AM End time: 05/31/2022 12:45 AM  Staffing Anesthesiologist: Barnet Glasgow, MD Performed: anesthesiologist   Preanesthetic Checklist Completed: patient identified, IV checked, site marked, risks and benefits discussed, surgical consent, monitors and equipment checked, pre-op evaluation and timeout performed  Epidural Patient position: sitting Prep: DuraPrep and site prepped and draped Patient monitoring: continuous pulse ox and blood pressure Approach: midline Location: L3-L4 Injection technique: LOR air  Needle:  Needle type: Tuohy  Needle gauge: 17 G Needle length: 9 cm and 9 Needle insertion depth: 7 cm Catheter type: closed end flexible Catheter size: 19 Gauge Catheter at skin depth: 12 cm Test dose: negative  Assessment Events: blood not aspirated, injection not painful, no injection resistance, no paresthesia and negative IV test  Additional Notes Patient identified. Risks/Benefits/Options discussed with patient including but not limited to bleeding, infection, nerve damage, paralysis, failed block, incomplete pain control, headache, blood pressure changes, nausea, vomiting, reactions to medication both or allergic, itching and postpartum back pain. Confirmed with bedside nurse the patient's most recent platelet count. Confirmed with patient that they are not currently taking any anticoagulation, have any bleeding history or any family history of bleeding disorders. Patient expressed understanding and wished to proceed. All questions were answered. Sterile technique was used throughout the entire procedure. Please see nursing notes for vital signs. Test dose was given through epidural needle and negative prior to continuing to dose epidural or start infusion. Warning signs of high block given to the patient including shortness of breath, tingling/numbness in hands, complete motor block, or any  concerning symptoms with instructions to call for help. Patient was given instructions on fall risk and not to get out of bed. All questions and concerns addressed with instructions to call with any issues. 1 Attempt (S) . Patient tolerated procedure well.

## 2022-05-31 NOTE — Op Note (Signed)
Operative Note   Patient: Tina Phillips  Date of Procedure: 05/30/2022 - 05/31/2022  Procedure: Primary Low Transverse Cesarean   Indications: failure to progress: arrest of dilation at 8 cm and non-reassuring fetal status  Pre-operative Diagnosis: arrest of dilation, fetal intolerance to labor.   Post-operative Diagnosis: Same  TOLAC Candidate: Yes   Surgeon: Surgeon(s) and Role:    * Venora Maples, MD - Primary  Assistants: Dr. Mikey Bussing, MD  An experienced assistant was required given the standard of surgical care given the complexity of the case.  This assistant was needed for exposure, dissection, suctioning, retraction, instrument exchange, assisting with delivery with administration of fundal pressure, and for overall help during the procedure.   Anesthesia: epidural  Anesthesiologist: Lowella Curb, MD   Antibiotics: Cefazolin and Azithromycin   Estimated Blood Loss: 500 ml   Total IV Fluids: 700 ml  Urine Output:  1300 cc OF clear urine  Specimens: placenta to pathology  Complications: no complications   Indications: Tina Phillips is a 31 y.o. G1P1001 with an IUP [redacted]w[redacted]d presenting for unscheduled, urgent cesarean secondary to the indications listed above. Clinical course notable for IOL for gestational diabetes at 40 weeks. She underwent IOL with misoprostol and then was started on pitocin. She had AROM around 0225 the day of delivery and was approximately 7-8 cm at that time. Over the next 12 or so hours she had intermittent periods of minimal variability and late decels, though intervening periods of FHT were reassuring. After 11 hours without cervical change she was offered and accepted primary cesarean for arrest of dilation and fetal intolerance of labor.   The risks of cesarean section discussed with the patient included but were not limited to: bleeding which may require transfusion or reoperation; infection which may require antibiotics; injury  to bowel, bladder, ureters or other surrounding organs; injury to the fetus; need for additional procedures including hysterectomy in the event of a life-threatening hemorrhage; placental abnormalities with subsequent pregnancies, incisional problems, thromboembolic phenomenon and other postoperative/anesthesia complications. The patient concurred with the proposed plan, giving informed written consent for the procedure. Patient NPO status waived given urgency of case. Anesthesia and OR aware. Preoperative prophylactic antibiotics and SCDs ordered on call to the OR.   Findings: Viable infant in cephalic, asynclitic, LOP presentation, no nuchal cord present. Apgars 8 , 8 , 9 . Weight 3770 g . Clear amniotic fluid. Normal placenta, three vessel cord. Normal uterus, Normal bilateral fallopian tubes, Normal bilateral ovaries.  Procedure Details: A Time Out was held and the above information confirmed. The patient received intravenous antibiotics and had sequential compression devices applied to her lower extremities preoperatively. The patient was taken back to the operative suite where epidural anesthesia was administered and dosed up to a surgical level. After induction of anesthesia, the patient was draped and prepped in the usual sterile manner and placed in a dorsal supine position with a leftward tilt. A low transverse skin incision was made with scalpel and carried down through the subcutaneous tissue to the fascia. Fascial incision was made and extended transversely. The fascia was separated from the underlying rectus tissue superiorly and inferiorly. The rectus muscles were separated in the midline bluntly and the peritoneum was entered bluntly. An Alexis retractor was placed to aid in visualization of the uterus. A bladder flap was not developed. A low transverse uterine incision was made. The infant was successfully delivered from cephalic, asynclitic LOP presentation, the umbilical cord was clamped after  1 minute. Cord ph was sent, and cord blood was obtained for evaluation. The placenta was removed Intact and appeared normal. The uterine incision was closed with running unlocked suture of 0-Monocryl in a single layer, and then Arista was applied to the hysterotomy. Overall, excellent hemostasis was noted. The abdomen and the pelvis were cleared of all clot and debris and the Ubaldo Glassing was removed. Hemostasis was confirmed on all surfaces.  The peritoneum was reapproximated using 2-0 vicryl . The fascia was then closed using 0 Vicryl in a running fashion. The subcutaneous layer was reapproximated with plain gut and the skin was closed with a 4-0 vicryl subcuticular stitch. The patient tolerated the procedure well. Sponge, lap, instrument and needle counts were correct x 2. She was taken to the recovery room in stable condition.  Disposition: PACU - hemodynamically stable.    Signed: Clarnce Flock, MD, MPH Center for [facility])

## 2022-05-31 NOTE — Progress Notes (Signed)
At bedside, Tina Phillips is now turned to maternal right. Discussed continuing late/variable decelerations, amnioinfusion bolus now in and she is running maintenance amnioinfusion. Discussed my concern looking back at her strip with her late/variable decelerations and that her cervix has not changed off of pitocin for the past four hours. Discussed if amnioinfusion does not improve her fetal strip that  I would not be comfortable starting pitocin. Broached the other option of giving terbutaline to stop contractions and discussing Cesarean section. Patient intermittently tearful. Family members including her mother had several questions at this time which I answered. Tina Phillips denies any questions. Given some time to discuss and process will family, and I will come back in to discuss more risks/benefits based on continued fetal strip.  Yehuda Savannah MD [facility] Faculty Practice

## 2022-05-31 NOTE — Discharge Summary (Addendum)
Postpartum Discharge Summary  Date of Service updated 06/02/22     Patient Name: Tina Phillips DOB: 1991-05-18 MRN: [id number]  Date of admission: 05/30/2022 Delivery date:05/31/2022  Delivering provider: Clarnce Flock  Date of discharge: 06/02/2022  Admitting diagnosis: Post-dates pregnancy [O48.0] Intrauterine pregnancy: [redacted]w[redacted]d    Secondary diagnosis:  Principal Problem:   Post-dates pregnancy Active Problems:   History of maternal deep vein thrombosis (DVT)   Gestational diabetes mellitus (GDM), antepartum  Additional problems:     Discharge diagnosis: Term Pregnancy Delivered and GDM A1                                              Post partum procedures: None Augmentation: AROM, Pitocin, and Cytotec Complications: None  Hospital course: Onset of Labor With Unplanned C/S   31y.o. yo G1P1001 at [id number] admitted in Latent Labor on 05/30/2022. Patient had a labor course significant for induction with misoprostol, pitocin, and AROM. The patient went for cesarean section due to Arrest of Dilation and Non-Reassuring FHR. Delivery details as follows: Membrane Rupture Time/Date: 2:25 AM ,05/31/2022   Delivery Method:C-Section, Low Transverse  Details of operation can be found in separate operative note. Patient had a postpartum course complicated by NA.  She is ambulating,tolerating a regular diet, passing flatus, and urinating well.  Patient is discharged home in stable condition 06/02/22.  Newborn Data: Birth date:05/31/2022  Birth time:2:05 PM  Gender:Female  Living status:Living  Apgars:8 ,8  Weight:3770 g   Magnesium Sulfate received: No BMZ received: No Rhophylac:N/A MMR:N/A T-DaP: declined prenatally Flu: declined prenatally Transfusion:No  Physical exam  Vitals:   06/01/22 0910 06/01/22 1400 06/01/22 2100 06/02/22 0626  BP: 99/70 95/69 111/70 119/75  Pulse: 74 79 81 69  Resp: _0 Temp: 98.2 F (36.8 C) 98.1 F (36.7 C) 98.1 F (36.7 C) 98.2  F (36.8 C)  TempSrc: Oral Oral Oral Oral  SpO2:  100%  100%  Weight:      Height:       General: alert Lochia: appropriate Uterine Fundus: firm Incision: Healing well with no significant drainage DVT Evaluation: No evidence of DVT seen on physical exam. Labs: Lab Results  Component Value Date   WBC 23.5 (H) 06/01/2022   HGB 10.2 (L) 06/01/2022   HCT 31.3 (L) 06/01/2022   MCV 90.5 06/01/2022   PLT 192 06/01/2022      Latest Ref Rng & Units 05/14/2022   10:01 PM  CMP  Glucose 70 - 99 mg/dL 116   BUN 6 - 20 mg/dL 7   Creatinine 0.44 - 1.00 mg/dL 0.81   Sodium 135 - 145 mmol/L 136   Potassium 3.5 - 5.1 mmol/L 3.6   Chloride 98 - 111 mmol/L 107   CO2 22 - 32 mmol/L 24   Calcium 8.9 - 10.3 mg/dL 9.8   Total Protein 6.5 - 8.1 g/dL 6.0   Total Bilirubin 0.3 - 1.2 mg/dL 0.6   Alkaline Phos 38 - 126 U/L 187   AST 15 - 41 U/L 19   ALT 0 - 44 U/L 21    Edinburgh Score:    06/01/2022   10:33 AM  Edinburgh Postnatal Depression Scale Screening Tool  I have been able to laugh and see the funny side of things. 1  I have looked forward with enjoyment to things.  1  I have blamed myself unnecessarily when things went wrong. 1  I have been anxious or worried for no good reason. 1  I have felt scared or panicky for no good reason. 1  Things have been getting on top of me. 1  I have been so unhappy that I have had difficulty sleeping. 0  I have felt sad or miserable. 1  I have been so unhappy that I have been crying. 1  The thought of harming myself has occurred to me. 0  Edinburgh Postnatal Depression Scale Total 8     After visit meds:  Allergies as of 06/02/2022   No Known Allergies      Medication List     STOP taking these medications    Accu-Chek Guide test strip Generic drug: glucose blood   Accu-Chek Guide w/Device Kit   Accu-Chek Softclix Lancets lancets   aspirin EC 81 MG tablet   ferrous sulfate 325 (65 FE) MG EC tablet   Insulin Syringe-Needle  U-100 31G X 15/64" 1 ML Misc       TAKE these medications    enoxaparin 40 MG/0.4ML injection Commonly known as: LOVENOX Inject 0.4 mLs (40 mg total) into the skin daily. Start taking on: June 03, 2022   HYDROcodone-acetaminophen 5-325 MG tablet Commonly known as: NORCO/VICODIN Take 1-2 tablets by mouth every 4 (four) hours as needed for moderate pain.   ibuprofen 600 MG tablet Commonly known as: ADVIL Take 1 tablet (600 mg total) by mouth every 6 (six) hours.   PRENATAL 1+1 PO Prenatal 1         Discharge home in stable condition Infant Feeding: Breast Infant Disposition:home with mother Discharge instruction: per After Visit Summary and Postpartum booklet. Activity: Advance as tolerated. Pelvic rest for 6 weeks.  Diet: routine diet Future Appointments: Future Appointments  Date Time Provider [place]  06/08/2022  2:00 PM [facility] NURSE [facility]  07/05/2022  8:50 AM [facility]  07/05/2022  9:55 AM Chancy Milroy, MD [facility]   Follow up Visit:   Please schedule this patient for a In person postpartum visit in 4 weeks with the following provider: Any provider. Additional Postpartum F/U:2 hour GTT and Incision check 1 week  High risk pregnancy complicated by: GDM and hx of DVT on lovenox Delivery mode:  C-Section, Low Transverse  Anticipated Birth Control:  Condoms   06/02/2022 Deloria Lair, DO  GME ATTESTATION:  I saw and evaluated the patient. I agree with the findings and the plan of care as documented in the resident's note. I have made changes to documentation as necessary.  Gerlene Fee, DO OB Fellow, [place] for [place] 06/02/2022, 9:17 AM

## 2022-05-31 NOTE — Progress Notes (Signed)
LABOR PROGRESS NOTE  Tina Phillips is a 31 y.o. G1P0 at [redacted]w[redacted]d  admitted for IOL for GDMA1  Subjective: Patient says she is in intense pain and would like an epidural.   Objective: BP 120/78   Pulse 74   Temp 98.2 F (36.8 C) (Oral)   Resp 18   Ht 5\' 7"  (1.702 m)   Wt 104.7 kg   LMP 08/19/2021 (Exact Date)   BMI 36.15 kg/m  or  Vitals:   05/30/22 1756 05/30/22 2028 05/30/22 2221 05/30/22 2335  BP: 110/72 110/72 (!) 128/91 120/78  Pulse: 77 78 84 74  Resp: 18  17 18   Temp:   98.2 F (36.8 C)   TempSrc:   Oral   Weight:      Height:        SVE Dilation: 4.5 Effacement (%): 40 Station: -2 Presentation: Vertex Exam by:: Ndulue, MD FHT: baseline rate 135, moderate varibility, -acel, -decel Toco: q2-3 minutes   Labs: Lab Results  Component Value Date   WBC 9.7 05/30/2022   HGB 12.5 05/30/2022   HCT 39.6 05/30/2022   MCV 91.7 05/30/2022   PLT 278 05/30/2022    Patient Active Problem List   Diagnosis Date Noted   Post-dates pregnancy 05/30/2022   Anticoagulant long-term use 04/19/2022   Gestational diabetes mellitus (GDM), antepartum 03/16/2022   Supervision of high risk pregnancy, antepartum 12/30/2021   History of maternal deep vein thrombosis (DVT) 12/30/2021   Anemia 12/30/2021   Migraine without aura 07/18/2015    Assessment / Plan: 31 y.o. G1P0 at [redacted]w[redacted]d here for IOL s/t GDMA1  Labor: Pitocin, continue to monitor, plan for AROM after epidural  Fetal Wellbeing:  Cat 1  Pain Control:  IV fentanyl, Consult anesthesiology for epidural  Anticipated MOD:  SVD  Lowry Ram, MD  PGY-1, [facility]  05/31/2022, 12:53 AM

## 2022-05-31 NOTE — Anesthesia Postprocedure Evaluation (Signed)
Anesthesia Post Note  Patient: Tina Phillips  Procedure(s) Performed: CESAREAN SECTION     Patient location during evaluation: PACU Anesthesia Type: Epidural Level of consciousness: awake and alert Pain management: pain level controlled Vital Signs Assessment: post-procedure vital signs reviewed and stable Respiratory status: spontaneous breathing, nonlabored ventilation and respiratory function stable Cardiovascular status: blood pressure returned to baseline and stable Postop Assessment: no apparent nausea or vomiting Anesthetic complications: no   No notable events documented.  Last Vitals:  Vitals:   05/31/22 1530 05/31/22 1545  BP: 93/63 107/71  Pulse: 93 94  Resp: (!) 21 (!) 26  Temp: (!) 38.2 C   SpO2: 92% 97%    Last Pain:  Vitals:   05/31/22 1620  TempSrc:   PainSc: 1    Pain Goal: Patients Stated Pain Goal: 3 (05/31/22 1620)  LLE Motor Response: Purposeful movement (05/31/22 1545)   RLE Motor Response: Purposeful movement (05/31/22 1545)   L Sensory Level: T12-Inguinal (groin) region (05/31/22 1545) R Sensory Level: T12-Inguinal (groin) region (05/31/22 1545) Epidural/Spinal Function Cutaneous sensation: Able to Wiggle Toes (05/31/22 1620), Patient able to flex knees: Yes (05/31/22 1620), Patient able to lift hips off bed: No (05/31/22 1620), Back pain beyond tenderness at insertion site: No (05/31/22 1620), Progressively worsening motor and/or sensory loss: No (05/31/22 1620), Bowel and/or bladder incontinence post epidural: No (05/31/22 1620)  Lynda Rainwater

## 2022-05-31 NOTE — Progress Notes (Signed)
Labor Progress Note Tina Phillips is a 31 y.o. G1P0 at [redacted]w[redacted]d presented for IOL for GDMA1.  S: Patient is resting comfortably. She notes her spirits are down with this process. Just turned to maternal left due to infant not tolerating maternal right.  O:  BP 113/66   Pulse 88   Temp 99.2 F (37.3 C) (Oral)   Resp 18   Ht 5\' 7"  (1.702 m)   Wt 104.7 kg   LMP 08/19/2021 (Exact Date)   SpO2 99%   BMI 36.15 kg/m  EFM: baseline 140s/minimal-moderate variability/+ intermittent late decels, intermittent variable decels, intermittent early decels, no accels  CVE: Dilation: 8 Effacement (%): 80 Station: -2 Presentation: Vertex Exam by:: [name], MD   A&P: 31 y.o. G1P0 [redacted]w[redacted]d presented for IOL for GDMA1.  #Labor: Has now been unchanged at 8cm for 4 hours. Contractions inadequate per IUPC. Off of pitocin due to intermittent lates/variable decels with contractions, started to be recurrent lates this AM. Will trial amnioinfusion along with position changes due to intermittent variables/lates with contractions. If decelerations improve with amnioinfusion,consider restarting pitocin to help with stronger contractions and cervical change. If not improved, would not be able to restart pitocin. Discussed with Dr Dione Plover. #Pain: epidural, comfortable #FWB: patient noted to have periods of minimal variability, and since position change this AM started to have recurrent late decels, amnioinfusion started, discussed with Dr Dione Plover, will see if amnioinfusion improves strip but if no improvement will not be able to restart pitocin #GBS negative  #GDMA1 -  BG 117, not checked since admission, will check q4hrs.   Lenoria Chime, MD Center for [place] [facility] 8:47 AM

## 2022-05-31 NOTE — Progress Notes (Signed)
At bedside with RN, repeat SVE unchanged. SVE 8/80/-2. Discussed with Tina Phillips and her husband Tina Phillips and Grandmother that at this point, we have increased pitocin to 4 and fetal tracing continues to show intermittent late decels. Discussed as we are still unchanged, and have been essentially unchanged since 0200, and while baby still has moderate variability we continue to have late and variable decels with contractions, I would not recommend continuing with pitocin and would recommend Cesarean section at this time. Discussed that Dr Dione Plover was in agreement with plan and called Dr Dione Plover to bedside to discuss Cesarean section and consent, see his progress note as well.  After discussion with Dr Dione Plover, plans to proceed with Cesarean section urgent at this time. OR aware.  Yehuda Savannah MD

## 2022-05-31 NOTE — Progress Notes (Signed)
Patient re-checked by Dr. Thompson Grayer after about two hours of pitocin with increasing contraction pattern and ongoing intermittent late decelerations. MVU's improved but remained inadequate. Cervix was unchanged.   I came to bedside to discuss with patient. At this point have been ruptured for about 11 hours without any significant change in cervix or fetal station. Suspect vertex malpresentation of some kind, OP, severe asynclitism, etc. Given prolonged ROM and lack of cervical change in addition to intermittent late decelerations meets criteria for arrest of dilation/failed IOL as well as fetal intolerance of labor. Recommended at this point that we proceed with primary cesarean.   The risks of cesarean section discussed with the patient included but were not limited to: bleeding which may require transfusion or reoperation; infection which may require antibiotics; injury to bowel, bladder, ureters or other surrounding organs; injury to the fetus; need for additional procedures including hysterectomy in the event of a life-threatening hemorrhage; placental abnormalities with subsequent pregnancies, incisional problems, thromboembolic phenomenon and other postoperative/anesthesia complications. The patient concurred with the proposed plan, giving informed written consent for the procedure. Patient NPO status waived given urgency of case. Anesthesia and OR aware. Preoperative prophylactic antibiotics and SCDs ordered on call to the OR.   Clarnce Flock, MD/MPH Attending Family Medicine Physician, [facility] for [facility], [place] [facility]  05/31/22 1:27 PM

## 2022-05-31 NOTE — Progress Notes (Signed)
LABOR PROGRESS NOTE  Tina Phillips is a 31 y.o. G1P0 at [redacted]w[redacted]d  admitted for IOL for GDMA1  Subjective: Patient is comfortable after getting an epidural.   Objective: BP 121/76   Pulse 81   Temp 98.3 F (36.8 C) (Oral)   Resp 17   Ht 5\' 7"  (1.702 m)   Wt 104.7 kg   LMP 08/19/2021 (Exact Date)   SpO2 99%   BMI 36.15 kg/m  or  Vitals:   05/31/22 0155 05/31/22 0205 05/31/22 0210 05/31/22 0215  BP: (!) 129/110 100/77 125/83 121/76  Pulse: 86 82 78 81  Resp:    17  Temp:    98.3 F (36.8 C)  TempSrc:    Oral  SpO2: 99%     Weight:      Height:        SVE Dilation: 7 Effacement (%): 70 Station: -1 Presentation: Vertex Exam by:: Ndulue, MD FHT: baseline rate 135, moderate varibility, -acel, + early decel  Toco: q3-4 minutes  Labs: Lab Results  Component Value Date   WBC 9.7 05/30/2022   HGB 12.5 05/30/2022   HCT 39.6 05/30/2022   MCV 91.7 05/30/2022   PLT 278 05/30/2022    Patient Active Problem List   Diagnosis Date Noted   Post-dates pregnancy 05/30/2022   Anticoagulant long-term use 04/19/2022   Gestational diabetes mellitus (GDM), antepartum 03/16/2022   Supervision of high risk pregnancy, antepartum 12/30/2021   History of maternal deep vein thrombosis (DVT) 12/30/2021   Anemia 12/30/2021   Migraine without aura 07/18/2015    Assessment / Plan: 31 y.o. G1P0 at [redacted]w[redacted]d here for IOL s/t GDMA1  Labor: AROM @ 0225, Stopped pitocin  Fetal Wellbeing:  Currently Cat 1 after stopping pitocin, had some late decels earlier before AROM  Pain Control:  Epidural  Anticipated MOD:  SVD  Lowry Ram, MD  PGY-1, [facility]  05/31/2022, 3:14 AM

## 2022-06-01 ENCOUNTER — Encounter (HOSPITAL_COMMUNITY): Payer: Self-pay | Admitting: Obstetrics and Gynecology

## 2022-06-01 LAB — GLUCOSE, CAPILLARY: Glucose-Capillary: 145 mg/dL — ABNORMAL HIGH (ref 70–99)

## 2022-06-01 LAB — CBC
HCT: 31.3 % — ABNORMAL LOW (ref 36.0–46.0)
Hemoglobin: 10.2 g/dL — ABNORMAL LOW (ref 12.0–15.0)
MCH: 29.5 pg (ref 26.0–34.0)
MCHC: 32.6 g/dL (ref 30.0–36.0)
MCV: 90.5 fL (ref 80.0–100.0)
Platelets: 192 10*3/uL (ref 150–400)
RBC: 3.46 MIL/uL — ABNORMAL LOW (ref 3.87–5.11)
RDW: 14.6 % (ref 11.5–15.5)
WBC: 23.5 10*3/uL — ABNORMAL HIGH (ref 4.0–10.5)
nRBC: 0 % (ref 0.0–0.2)

## 2022-06-01 NOTE — Social Work (Signed)
MOB was referred for Behavioral health issues impacting hospitalization/discharge Referral screened out by Clinical Social Worker. Per chart review, no mental health or Behavioral health concerns, CSW spoke with MOB's nurse and OB, no mental health concerns reported. MOB reported to be appropriate with infant.   Please contact the Clinical Social Worker if needs arise or by MOB request.  Sareen Randon, LCSWA Clinical Social Worker [phone] 

## 2022-06-01 NOTE — Progress Notes (Signed)
POSTPARTUM PROGRESS NOTE  POD #1  Subjective:  Tina Phillips is a 31 y.o. G1P1001 s/p pLTCS at [redacted]w[redacted]d.  She reports she doing well. No acute events overnight. She reports she is doing better. She denies any problems with ambulating, voiding or po intake. Denies nausea or vomiting. She has has passed flatus. Pain is moderately controlled.  Lochia is appropriate.  Objective: Blood pressure 99/70, pulse 74, temperature 98.2 F (36.8 C), temperature source Oral, resp. rate 18, height 5\' 7"  (1.702 m), weight 104.7 kg, last menstrual period 08/19/2021, SpO2 100 %, unknown if currently breastfeeding.  Physical Exam:  General: alert, cooperative and no distress Chest: no respiratory distress Heart:regular rate, distal pulses intact Abdomen: soft, nontender,  Uterine Fundus: firm, appropriately tender DVT Evaluation: No calf swelling or tenderness Extremities: Mild LE edema Skin: warm, dry; incision clean/dry/intact w/ pressure dressing in place  Recent Labs    05/30/22 1052 06/01/22 0449  HGB 12.5 10.2*  HCT 39.6 31.3*    Assessment/Plan: Tina Phillips is a 31 y.o. G1P1001 s/p pLTCS at [redacted]w[redacted]d for AOD, NRFHT.  POD#1 - Doing welll; pain moderately controlled. H/H appropriate  Routine postpartum care  OOB, ambulated  Lovenox for VTE prophylaxis Anemia: asymptomatic  Contraception: Condoms Feeding: Breast  Dispo: Plan for discharge 10/11-10/12.   LOS: 2 days   Gerlene Fee, DO OB Fellow, [place] for [place] 06/01/2022, 12:27 PM

## 2022-06-01 NOTE — Progress Notes (Signed)
Latest Reference Range & Units 06/01/22 06:20  Glucose-Capillary 70 - 99 mg/dL 145 (H)  (H): Data is abnormally high  CBG not a true fasting. Pt reports consuming several packs of graham crackers throughout the night with the last packs approximately 1 hr ago.   Rocky Crafts, RN 06/01/22

## 2022-06-02 ENCOUNTER — Other Ambulatory Visit (HOSPITAL_COMMUNITY): Payer: Self-pay

## 2022-06-02 ENCOUNTER — Other Ambulatory Visit (INDEPENDENT_AMBULATORY_CARE_PROVIDER_SITE_OTHER): Payer: 59 | Admitting: Obstetrics and Gynecology

## 2022-06-02 DIAGNOSIS — Z98891 History of uterine scar from previous surgery: Secondary | ICD-10-CM

## 2022-06-02 MED ORDER — HYDROCODONE-ACETAMINOPHEN 5-325 MG PO TABS: 1.0000 | ORAL_TABLET | ORAL | 0 refills | Status: DC | PRN

## 2022-06-02 MED ORDER — IBUPROFEN 600 MG PO TABS
600.0000 mg | ORAL_TABLET | Freq: Four times a day (QID) | ORAL | 0 refills | Status: AC
  Filled 2022-06-02: qty 30, 8d supply, fill #0

## 2022-06-02 MED ORDER — ENOXAPARIN SODIUM 40 MG/0.4ML IJ SOSY
40.0000 mg | PREFILLED_SYRINGE | INTRAMUSCULAR | 0 refills | Status: DC
  Filled 2022-06-02: qty 12, 30d supply, fill #0

## 2022-06-02 NOTE — Progress Notes (Signed)
Orders signed for norco, answering service called that pt discharged with paper script.  No electronic rx sent per pharmacist.  Previous rx possibly sent to transitions of care

## 2022-06-03 ENCOUNTER — Other Ambulatory Visit: Payer: Self-pay | Admitting: Obstetrics & Gynecology

## 2022-06-03 ENCOUNTER — Other Ambulatory Visit: Payer: Self-pay | Admitting: Obstetrics and Gynecology

## 2022-06-03 ENCOUNTER — Telehealth: Payer: Self-pay | Admitting: Family Medicine

## 2022-06-03 DIAGNOSIS — Z98891 History of uterine scar from previous surgery: Secondary | ICD-10-CM

## 2022-06-03 MED ORDER — HYDROCODONE-ACETAMINOPHEN 5-325 MG PO TABS: 1.0000 | ORAL_TABLET | ORAL | 0 refills | Status: DC | PRN

## 2022-06-03 MED ORDER — HYDROCODONE-ACETAMINOPHEN 5-325 MG PO TABS: 1.0000 | ORAL_TABLET | Freq: Four times a day (QID) | ORAL | 0 refills | Status: AC | PRN

## 2022-06-03 NOTE — Progress Notes (Signed)
Called from answering service.  Pt still has not yet received hydrocodone.  Order placed  Janyth Pupa, DO Attending [place], [facility] for [employer], [place]

## 2022-06-03 NOTE — Telephone Encounter (Signed)
Patient called in wanting to discuss pain medication prescription being sent in. She is having trouble with her pharmacy and I still experiencing pain.

## 2022-06-03 NOTE — Telephone Encounter (Signed)
Called pt and discussed her concern.  She stated that she is unable to obtain the paper Rx for Hydrocodone from [facility]. She was told it had to be faxed. [facility] called [facility] and spoke with pharmacist. She agreed to fill pt's paper Rx provided I would verify the Rx. I provided details of the Rx and she made a note so that pt could obtain the medication. Pt was informed that she may now take the [facility] to [facility] and the Rx will be filled. She voiced understanding and expressed extreme gratitude.

## 2022-06-04 LAB — SURGICAL PATHOLOGY

## 2022-06-08 ENCOUNTER — Ambulatory Visit (INDEPENDENT_AMBULATORY_CARE_PROVIDER_SITE_OTHER): Payer: 59 | Admitting: *Deleted

## 2022-06-08 VITALS — BP 116/76 | HR 73 | Ht 67.0 in | Wt 208.5 lb

## 2022-06-08 DIAGNOSIS — Z4889 Encounter for other specified surgical aftercare: Secondary | ICD-10-CM

## 2022-06-08 NOTE — Progress Notes (Signed)
Here for wound check s/p C/S 05/31/22 . Wound CDI without redness or edema. Reviewed wound care and postpartum appointment. She voices understanding. Staci Acosta

## 2022-06-16 ENCOUNTER — Telehealth: Payer: Self-pay

## 2022-06-16 NOTE — Telephone Encounter (Signed)
Call received from [facility] RN regarding patient. RN reports Edinburgh 11. States she is planning to refer patient to virtual counseling agency [employer].

## 2022-06-22 ENCOUNTER — Encounter: Payer: Self-pay | Admitting: General Practice

## 2022-06-22 NOTE — Telephone Encounter (Signed)
Mychart message sent.

## 2022-06-23 DIAGNOSIS — Z419 Encounter for procedure for purposes other than remedying health state, unspecified: Secondary | ICD-10-CM | POA: Diagnosis not present

## 2022-07-01 ENCOUNTER — Other Ambulatory Visit: Payer: Self-pay | Admitting: *Deleted

## 2022-07-01 DIAGNOSIS — O24429 Gestational diabetes mellitus in childbirth, unspecified control: Secondary | ICD-10-CM

## 2022-07-05 ENCOUNTER — Ambulatory Visit: Payer: Self-pay | Admitting: Obstetrics and Gynecology

## 2022-07-05 ENCOUNTER — Other Ambulatory Visit: Payer: Self-pay

## 2022-07-09 ENCOUNTER — Telehealth: Payer: Self-pay | Admitting: Family Medicine

## 2022-07-09 NOTE — Telephone Encounter (Signed)
Patient called in stating that her period has gotten increasingly worse and she ants to speak with a nurse about it.

## 2022-07-12 NOTE — Telephone Encounter (Signed)
Returned patient's call. Received message that call cannot be completed at this time. Called back and was sent to voicemail.   LM for patient to call the office at her convenience if she is still having concerns. Will send My Chart Message.

## 2022-07-20 ENCOUNTER — Ambulatory Visit (INDEPENDENT_AMBULATORY_CARE_PROVIDER_SITE_OTHER): Payer: 59 | Admitting: Obstetrics and Gynecology

## 2022-07-20 ENCOUNTER — Other Ambulatory Visit: Payer: Self-pay

## 2022-07-20 ENCOUNTER — Encounter: Payer: Self-pay | Admitting: Obstetrics and Gynecology

## 2022-07-20 DIAGNOSIS — O0990 Supervision of high risk pregnancy, unspecified, unspecified trimester: Secondary | ICD-10-CM

## 2022-07-20 MED ORDER — NORETHINDRONE 0.35 MG PO TABS: 1.0000 | ORAL_TABLET | Freq: Every day | ORAL | 11 refills | Status: DC

## 2022-07-20 NOTE — Progress Notes (Signed)
Post Partum Visit Note  Tina Phillips is a 31 y.o. G51P1001 female who presents for a postpartum visit. She is 7 weeks 1 day postpartum following a primary cesarean section.  I have fully reviewed the prenatal and intrapartum course. The delivery was at 40 gestational weeks 5 days.  Anesthesia: epidural. Postpartum course has been little rough. Baby is doing well. Baby is feeding by breast. Bleeding no bleeding. Bowel function is normal. Bladder function is normal. Patient is sexually active. Contraception method is none. Postpartum depression screening: positive.  Previously on OCPs and got a blood clot in the ankle with subsequent anticoagulation in pregnancy. Has had intercourse without issue since delivery. Husband drives trucks and can be gone for up to 2 weeks at a time. Mother, father, and brother are home with her.   The pregnancy intention screening data noted above was reviewed. Potential methods of contraception were discussed. The patient elected to proceed with No data recorded.   Edinburgh Postnatal Depression Scale - 07/20/22 1358       Edinburgh Postnatal Depression Scale:  In the Past 7 Days   I have been able to laugh and see the funny side of things. 0    I have looked forward with enjoyment to things. 0    I have blamed myself unnecessarily when things went wrong. 2    I have been anxious or worried for no good reason. 0    I have felt scared or panicky for no good reason. 0    Things have been getting on top of me. 2    I have been so unhappy that I have had difficulty sleeping. 2    I have felt sad or miserable. 1    I have been so unhappy that I have been crying. 2    The thought of harming myself has occurred to me. 1   not wanting to hurt herself nut wanting something to happen   Edinburgh Postnatal Depression Scale Total 10             Health Maintenance Due  Topic Date Due   PAP SMEAR-Modifier  Never done    The following portions of the patient's  history were reviewed and updated as appropriate: allergies, current medications, past family history, past medical history, past social history, past surgical history, and problem list.  Review of Systems Pertinent items are noted in HPI.  Objective:  BP 111/76   Pulse 85   Ht 5\' 7"  (1.702 m)   Wt 214 lb 8 oz (97.3 kg)   Breastfeeding Yes   BMI 33.60 kg/m    General:  alert and cooperative   Breasts:  normal  Lungs: clear to auscultation bilaterally  Heart:  regular rate and rhythm, S1, S2 normal, no murmur, click, rub or gallop  Abdomen: soft, non-tender; bowel sounds normal; no masses,  no organomegaly   Wound well approximated incision  GU exam:  not indicated       Assessment:    Postpartum state  Supervision of high risk pregnancy, antepartum  Normal  postpartum exam.   Plan:   Essential components of care per ACOG recommendations:  1.  Mood and well being: Patient with positive depression screening today. Reviewed local resources for support.  - Patient tobacco use? No.   - hx of drug use? No.    Will follow with [facility] in [facility]. Denies active SI/HI at this time.   2. Infant care and feeding:  -  Patient currently breastmilk feeding? Yes. Discussed returning to work and pumping.  -Social determinants of health (SDOH) reviewed in EPIC. No concerns  3. Sexuality, contraception and birth spacing - Patient does want a pregnancy in the next year.  Desired family size is 2 children.  - Reviewed reproductive life planning. Reviewed contraceptive methods based on pt preferences and effectiveness.  Patient desired Oral Contraceptive today.   - Discussed birth spacing of 18 months  4. Sleep and fatigue -Encouraged family/partner/community support of 4 hrs of uninterrupted sleep to help with mood and fatigue  5. Physical Recovery  - Discussed patients delivery and complications. She describes her labor as bad. Did not want to get induced and FHR was fluctuating due to the  medication and then had to have cesarean section.  - Patient had a C-section. Patient did not have a laceration. Perineal healing reviewed. Patient expressed understanding - Patient has urinary incontinence? No. - Patient is safe to resume physical and sexual activity  6.  Health Maintenance - HM due items addressed Yes - Last pap smear 05/2020 NILM, HRHPV positive Pap smear not done at today's visit - would like to return on separate visit to complete   -Breast Cancer screening indicated? No.   7. Chronic Disease/Pregnancy Condition follow up: Anemia  - PCP follow up  Lorriane Shire, MD Center for [facility], [facility], [facility]

## 2022-07-23 DIAGNOSIS — Z419 Encounter for procedure for purposes other than remedying health state, unspecified: Secondary | ICD-10-CM | POA: Diagnosis not present

## 2022-07-26 NOTE — BH Specialist Note (Signed)
Integrated Behavioral Health via Telemedicine Visit  08/03/2022 [name] [id number]  Number of Integrated Behavioral Health Clinician visits: 1- Initial Visit  Session Start time: 1422   Session End time: 1503  Total time in minutes: 41   Referring Provider: Lorriane Shire, MD Patient/Family location: [facility] Provider location: Center for [facility] at [facility]  All persons participating in visit: Patient Tina Phillips Control and instrumentation engineer) and [facility] Armida Vickroy   Types of Service: Individual psychotherapy and Video visit  I connected with [name] and/or Shawn Route Tafolla's  n/a  via  Telephone or Video Enabled Telemedicine Application  (Video is Caregility application) and verified that I am speaking with the correct person using two identifiers. Discussed confidentiality: Yes   I discussed the limitations of telemedicine and the availability of in person appointments.  Discussed there is a possibility of technology failure and discussed alternative modes of communication if that failure occurs.  I discussed that engaging in this telemedicine visit, they consent to the provision of behavioral healthcare and the services will be billed under their insurance.  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 Pt is requesting medical accomodation note to be able to continue working from home for at least six months.  Duration of problem: Postpartum; Severity of problem: moderate  Patient and/or Family's Strengths/Protective Factors: Social connections, Concrete supports in place  (healthy food, safe environments, etc.), and Sense of purpose  Goals Addressed: Patient will:  Reduce symptoms of: agitation, anxiety, depression, and mood instability   Increase knowledge and/or ability of: stress reduction   Demonstrate ability to: Increase healthy adjustment to current life circumstances and Increase adequate support systems for patient/family  Progress towards Goals: Ongoing  Interventions: Interventions utilized:  Functional Assessment of ADLs, Psychoeducation and/or Health Education, Link to [employer], and Supportive Reflection Standardized Assessments completed: GAD-7 and PHQ 9  Patient and/or Family Response: Patient agrees with treatment plan.   Assessment: Patient currently experiencing Mood disorder, unspecified  Patient may benefit from psychoeducation and brief therapeutic interventions regarding coping with symptoms of depression and anxiety .  Plan: Follow up with behavioral health clinician on : Two weeks Behavioral recommendations:  -Accept WIC referral --Continue prioritizing healthy self-care (regular meals, adequate rest; allowing practical help from supportive friends and family)  -Consider new mom support group as needed at either [email or web address] or [email or web address]   Referral(s): Integrated Art gallery manager (In Clinic) and [employer]:  new mom support  I discussed the assessment and treatment plan with the patient and/or parent/guardian. They were provided an opportunity to ask questions and all were answered. They agreed with the plan and demonstrated an understanding of the instructions.   They were advised to call back or seek an in-person evaluation if the symptoms worsen or if the condition fails to improve as anticipated.  [name], LCSW      08/03/2022    2:40 PM 05/27/2022    3:17 PM 05/20/2022    5:19 PM 05/07/2022   10:00 AM 01/13/2022    4:22 PM  Depression screen PHQ 2/9   Decreased Interest 2 0 0 0 3  Down, Depressed, Hopeless 2 0 0 1  1  PHQ - 2 Score 4 0 0 1 4  Altered sleeping 3 0 0 2 0  Tired, decreased energy 1 0 0 1 0  Change in appetite 1 0 0 2 0  Feeling bad or failure about yourself  1 0 0 0 0  Trouble concentrating 0 0 0 0 0  Moving slowly or fidgety/restless 0 0 0 0 0  Suicidal thoughts 0 0 0 0 0  PHQ-9 Score 10 0 0 6 4      08/03/2022    2:43 PM 05/27/2022    3:16 PM 05/20/2022    5:20 PM 05/07/2022   10:01 AM  GAD 7 : Generalized Anxiety Score  Nervous, Anxious, on Edge 1 0 0 0  Control/stop worrying 0 0 0 0  Worry too much - different things 3 0 0 0  Trouble relaxing 0 0 0 0  Restless 0 0 0 0  Easily annoyed or irritable 1 0 0 0  Afraid - awful might happen 1 0 0 0  Total GAD 7 Score 6 0 0 0

## 2022-07-28 ENCOUNTER — Telehealth: Payer: Self-pay | Admitting: Family Medicine

## 2022-07-28 NOTE — Telephone Encounter (Signed)
Patient would like to know if her birth control pill medication was sent to her pharmacy.

## 2022-07-29 ENCOUNTER — Encounter: Payer: Self-pay | Admitting: *Deleted

## 2022-07-29 ENCOUNTER — Encounter: Payer: Self-pay | Admitting: Obstetrics and Gynecology

## 2022-07-29 NOTE — Telephone Encounter (Signed)
Called pt and she stated that she has seen in her Mychart that the prescription was sent. I confirmed this information.  Pt had no further questions.

## 2022-08-03 ENCOUNTER — Ambulatory Visit (INDEPENDENT_AMBULATORY_CARE_PROVIDER_SITE_OTHER): Payer: 59 | Admitting: Clinical

## 2022-08-03 ENCOUNTER — Encounter: Payer: Self-pay | Admitting: Family Medicine

## 2022-08-03 DIAGNOSIS — F4323 Adjustment disorder with mixed anxiety and depressed mood: Secondary | ICD-10-CM

## 2022-08-03 DIAGNOSIS — F39 Unspecified mood [affective] disorder: Secondary | ICD-10-CM

## 2022-08-03 NOTE — Patient Instructions (Signed)
[facility] [address] [place], [place] [address] [phone] (main office) [phone] (Alissia Lory's office)  New Parent Support Groups [email or web address]   

## 2022-08-04 NOTE — BH Specialist Note (Signed)
Pt did not arrive to video visit and did not answer the phone; Left HIPPA-compliant message to call back Bambie Pizzolato from [facility] at  [phone] (Caydin Yeatts's office).  ?; left MyChart message for patient.  ? ?

## 2022-08-05 ENCOUNTER — Encounter: Payer: Self-pay | Admitting: *Deleted

## 2022-08-10 ENCOUNTER — Telehealth: Payer: Self-pay | Admitting: Clinical

## 2022-08-10 NOTE — Telephone Encounter (Signed)
Pt left message, requesting information about her medical accommodation paperwork.   She is requesting to work from home due to her anxiety, depression and mood instability that will affect her work negatively if she were to have to return to in-person work this soon.   Pt was informed on 08/03/2022 that [facility] is unable to complete paperwork, that it will have to be completed by either a medical provider (ob/gyn or psychiatry); was referred to psychiatry on 08/03/2022 and medical provider notified.   Referring psychiatrist will not be available until at least next week, possibly longer; she has not been called to schedule initial appointment at this time.

## 2022-08-18 ENCOUNTER — Telehealth: Payer: Self-pay | Admitting: Clinical

## 2022-08-18 ENCOUNTER — Ambulatory Visit: Payer: Medicaid Other | Admitting: Clinical

## 2022-08-18 DIAGNOSIS — Z91199 Patient's noncompliance with other medical treatment and regimen due to unspecified reason: Secondary | ICD-10-CM

## 2022-08-18 NOTE — Telephone Encounter (Signed)
Pt called and left message on 08/17/22, requesting information about her paperwork for work.

## 2022-08-18 NOTE — Telephone Encounter (Signed)
Returned pt call inquiring about paperwork. Pt informed that the paperwork will need to go through psychiatry; advised to keep her phone available to receive call to schedule virtual appointment with psychiatry for assessment and recommendations.

## 2022-08-20 ENCOUNTER — Telehealth: Payer: Self-pay | Admitting: Clinical

## 2022-08-20 NOTE — Telephone Encounter (Signed)
Attempt to reach pt to return her call (to return my last call); Left HIPPA-compliant message to call back Asher Muir from [employer] for [employer] at [facility] for Women at  [phone] [facility] office).

## 2022-08-23 DIAGNOSIS — Z419 Encounter for procedure for purposes other than remedying health state, unspecified: Secondary | ICD-10-CM | POA: Diagnosis not present

## 2022-08-26 ENCOUNTER — Ambulatory Visit: Payer: Medicaid Other | Admitting: Obstetrics and Gynecology

## 2022-09-09 ENCOUNTER — Ambulatory Visit (INDEPENDENT_AMBULATORY_CARE_PROVIDER_SITE_OTHER): Payer: Medicaid Other | Admitting: Psychiatry

## 2022-09-09 ENCOUNTER — Encounter (HOSPITAL_COMMUNITY): Payer: Self-pay | Admitting: Psychiatry

## 2022-09-09 ENCOUNTER — Encounter (HOSPITAL_COMMUNITY): Payer: Self-pay

## 2022-09-09 DIAGNOSIS — F411 Generalized anxiety disorder: Secondary | ICD-10-CM | POA: Insufficient documentation

## 2022-09-09 DIAGNOSIS — F4310 Post-traumatic stress disorder, unspecified: Secondary | ICD-10-CM | POA: Insufficient documentation

## 2022-09-09 DIAGNOSIS — F53 Postpartum depression: Secondary | ICD-10-CM | POA: Diagnosis not present

## 2022-09-09 DIAGNOSIS — F4000 Agoraphobia, unspecified: Secondary | ICD-10-CM | POA: Insufficient documentation

## 2022-09-09 MED ORDER — SERTRALINE HCL 25 MG PO TABS: 25.0000 mg | ORAL_TABLET | Freq: Every day | ORAL | 0 refills | Status: DC

## 2022-09-09 NOTE — Patient Instructions (Addendum)
We started Zoloft 25 mg once daily today.  This is safety use in breast-feeding and if you would like to see more information on it please go to this [email or web address]  Please coordinate with your PCP to obtain an iron panel, CBC, vitamin D, vitamin B12 level as these can impact your mood.  As we discussed I will fill out what I can for the work note, though the long-term plan would be to have the return to the work setting as part of your exposure therapy to address agoraphobia.  For the issue concerning being able to pump breast milk at work, place consult this website: https://www.dol.gov/agencies/whd/nursing-mothers/faq#:~:text=The%20statute%20requires%20employers%20to,obligation%20to%20provide%20a%20space.  Some specific information from the website includes:  Do employers have to provide a space to pump breastmilk even if they don't have any nursing employees? The statute requires employers to provide a space for a nursing employee "each time such employee has need to express the milk." If there is no employee with a need to express breast milk, then the employer would not have an obligation to provide a space. It is a best practice for employers to consider where they will make space available when it is needed.  If the only space available to express breastmilk at a work site is a bathroom, can employers require employees to express breast milk there? No. The statute specifically states that the space provided for employees to express breast milk cannot be a bathroom.  What should I do if I am denied space or break time to pump? If you think your right to reasonable break time and a space that is shielded from view and free from intrusion to pump at work has been violated, you may file a complaint with the Wage and Hour Division or file a private cause of action seeking appropriate remedies. You can call or visit any Wage and Hour Office  to ask questions or file a complaint. You can also call our toll-free help line: I-866-4USWAGE 7145273802). If you are deaf, hard of hearing, or have a speech disability, please dial [phone] to access telecommunications relay services.  If you choose to file a private action for your employer's failure to provide an appropriate space to pump, you may be required to provide your employer notice of the failure and 10 days to come into compliance.  You are not required to provide notice, however, before filing a complaint with the Wage and Hour Division or when filing a private action for your employer's failure to provide reasonable break time. An employer cannot retaliate against you for exercising your rights, filing a complaint or cooperating with an investigation.  What might I be able to recover if my right to pump at work was violated? If your right to pump at work was violated you may be entitled to remedies that are available under the [place] such as employment, reinstatement, promotion, payment of wages lost and an equal amount as liquidated damages, compensatory damages and make-whole relief, such as economic losses that resulted from violations, and punitive damages where appropriate.
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
## 2022-09-16 ENCOUNTER — Encounter (HOSPITAL_COMMUNITY): Payer: Self-pay | Admitting: *Deleted

## 2022-09-17 DIAGNOSIS — F419 Anxiety disorder, unspecified: Secondary | ICD-10-CM | POA: Diagnosis not present

## 2022-09-17 DIAGNOSIS — R519 Headache, unspecified: Secondary | ICD-10-CM | POA: Diagnosis not present

## 2022-09-17 DIAGNOSIS — E669 Obesity, unspecified: Secondary | ICD-10-CM | POA: Diagnosis not present

## 2022-09-20 ENCOUNTER — Encounter (HOSPITAL_COMMUNITY): Payer: Self-pay | Admitting: *Deleted

## 2022-09-23 DIAGNOSIS — Z419 Encounter for procedure for purposes other than remedying health state, unspecified: Secondary | ICD-10-CM | POA: Diagnosis not present

## 2022-10-08 ENCOUNTER — Telehealth (HOSPITAL_COMMUNITY): Payer: Medicaid Other | Admitting: Psychiatry

## 2022-10-09 ENCOUNTER — Other Ambulatory Visit (HOSPITAL_COMMUNITY): Payer: Self-pay | Admitting: Psychiatry

## 2022-10-09 DIAGNOSIS — F4310 Post-traumatic stress disorder, unspecified: Secondary | ICD-10-CM

## 2022-10-09 DIAGNOSIS — F411 Generalized anxiety disorder: Secondary | ICD-10-CM

## 2022-10-09 DIAGNOSIS — F53 Postpartum depression: Secondary | ICD-10-CM

## 2022-10-09 DIAGNOSIS — F4000 Agoraphobia, unspecified: Secondary | ICD-10-CM

## 2022-10-22 DIAGNOSIS — Z419 Encounter for procedure for purposes other than remedying health state, unspecified: Secondary | ICD-10-CM | POA: Diagnosis not present

## 2022-11-08 DIAGNOSIS — R519 Headache, unspecified: Secondary | ICD-10-CM | POA: Diagnosis not present

## 2022-11-08 DIAGNOSIS — Z0000 Encounter for general adult medical examination without abnormal findings: Secondary | ICD-10-CM | POA: Diagnosis not present

## 2022-11-08 DIAGNOSIS — F419 Anxiety disorder, unspecified: Secondary | ICD-10-CM | POA: Diagnosis not present

## 2022-11-08 DIAGNOSIS — E669 Obesity, unspecified: Secondary | ICD-10-CM | POA: Diagnosis not present

## 2022-11-22 DIAGNOSIS — Z86718 Personal history of other venous thrombosis and embolism: Secondary | ICD-10-CM | POA: Diagnosis not present

## 2022-11-22 DIAGNOSIS — Z9189 Other specified personal risk factors, not elsewhere classified: Secondary | ICD-10-CM | POA: Diagnosis not present

## 2022-11-22 DIAGNOSIS — E669 Obesity, unspecified: Secondary | ICD-10-CM | POA: Diagnosis not present

## 2022-11-22 DIAGNOSIS — Z419 Encounter for procedure for purposes other than remedying health state, unspecified: Secondary | ICD-10-CM | POA: Diagnosis not present

## 2022-11-22 DIAGNOSIS — Z131 Encounter for screening for diabetes mellitus: Secondary | ICD-10-CM | POA: Diagnosis not present

## 2022-11-22 DIAGNOSIS — F419 Anxiety disorder, unspecified: Secondary | ICD-10-CM | POA: Diagnosis not present

## 2022-11-22 DIAGNOSIS — R519 Headache, unspecified: Secondary | ICD-10-CM | POA: Diagnosis not present

## 2022-11-22 DIAGNOSIS — D508 Other iron deficiency anemias: Secondary | ICD-10-CM | POA: Diagnosis not present

## 2022-12-15 DIAGNOSIS — F419 Anxiety disorder, unspecified: Secondary | ICD-10-CM | POA: Diagnosis not present

## 2022-12-15 DIAGNOSIS — Z86718 Personal history of other venous thrombosis and embolism: Secondary | ICD-10-CM | POA: Diagnosis not present

## 2022-12-15 DIAGNOSIS — Z9189 Other specified personal risk factors, not elsewhere classified: Secondary | ICD-10-CM | POA: Diagnosis not present

## 2022-12-15 DIAGNOSIS — D508 Other iron deficiency anemias: Secondary | ICD-10-CM | POA: Diagnosis not present

## 2022-12-15 DIAGNOSIS — E669 Obesity, unspecified: Secondary | ICD-10-CM | POA: Diagnosis not present

## 2022-12-15 DIAGNOSIS — R519 Headache, unspecified: Secondary | ICD-10-CM | POA: Diagnosis not present

## 2022-12-17 DIAGNOSIS — N939 Abnormal uterine and vaginal bleeding, unspecified: Secondary | ICD-10-CM | POA: Diagnosis not present

## 2022-12-17 DIAGNOSIS — R0789 Other chest pain: Secondary | ICD-10-CM | POA: Diagnosis not present

## 2022-12-17 DIAGNOSIS — R0600 Dyspnea, unspecified: Secondary | ICD-10-CM | POA: Diagnosis not present

## 2022-12-17 DIAGNOSIS — Z20822 Contact with and (suspected) exposure to covid-19: Secondary | ICD-10-CM | POA: Diagnosis not present

## 2022-12-17 DIAGNOSIS — I498 Other specified cardiac arrhythmias: Secondary | ICD-10-CM | POA: Diagnosis not present

## 2022-12-17 DIAGNOSIS — R079 Chest pain, unspecified: Secondary | ICD-10-CM | POA: Diagnosis not present

## 2022-12-17 DIAGNOSIS — R0602 Shortness of breath: Secondary | ICD-10-CM | POA: Diagnosis not present

## 2022-12-22 DIAGNOSIS — Z419 Encounter for procedure for purposes other than remedying health state, unspecified: Secondary | ICD-10-CM | POA: Diagnosis not present

## 2023-01-05 DIAGNOSIS — F339 Major depressive disorder, recurrent, unspecified: Secondary | ICD-10-CM | POA: Diagnosis not present

## 2023-01-05 DIAGNOSIS — F419 Anxiety disorder, unspecified: Secondary | ICD-10-CM | POA: Diagnosis not present

## 2023-01-10 ENCOUNTER — Ambulatory Visit (INDEPENDENT_AMBULATORY_CARE_PROVIDER_SITE_OTHER): Payer: Medicaid Other | Admitting: Obstetrics & Gynecology

## 2023-01-10 DIAGNOSIS — Z3A01 Less than 8 weeks gestation of pregnancy: Secondary | ICD-10-CM

## 2023-01-10 DIAGNOSIS — O219 Vomiting of pregnancy, unspecified: Secondary | ICD-10-CM

## 2023-01-10 DIAGNOSIS — O0991 Supervision of high risk pregnancy, unspecified, first trimester: Secondary | ICD-10-CM

## 2023-01-10 DIAGNOSIS — Z3201 Encounter for pregnancy test, result positive: Secondary | ICD-10-CM

## 2023-01-10 LAB — POCT PREGNANCY, URINE: Preg Test, Ur: POSITIVE — AB

## 2023-01-10 MED ORDER — PREPLUS 27-1 MG PO TABS
1.0000 | ORAL_TABLET | Freq: Every day | ORAL | 11 refills | Status: AC
Start: 2023-01-10 — End: 2023-02-09

## 2023-01-10 MED ORDER — PROMETHAZINE HCL 25 MG PO TABS
25.0000 mg | ORAL_TABLET | Freq: Four times a day (QID) | ORAL | 1 refills | Status: AC | PRN
Start: 2023-01-10 — End: ?

## 2023-01-10 NOTE — Progress Notes (Signed)
Patient was assessed and managed by nursing staff during this encounter. I have reviewed the chart and agree with the documentation and plan. I have also made any necessary editorial changes.  Scheryl Darter, MD 01/10/2023 5:14 PM

## 2023-01-10 NOTE — Progress Notes (Signed)
Possible Pregnancy  Here today for pregnancy confirmation; patient left urine sample to be tested and to call back with results at [phone]. UPT in office today is positive. Pt reports first positive home UPT on 01/08/23. Reviewed dating with patient:   LMP: 12/03/22--per patient, she did not have consistent periods before; scheduled for dating u/s on Friday 5/24 at 9:30AM, patient notified via MyChart message.  EDD: 09/09/2023 5w 3d today  OB history reviewed. Patient is now a G2P1. Last pregnancy/delivery was 7 months ago, on 05/31/22--c-section. Patient has hx of diet-controlled GDM, DVT in 2019 (was on blood thinners during last pregnancy).    Reviewed medications and allergies with patient; list of medications safe to take during pregnancy given.  Recommended pt begin prenatal vitamin and schedule prenatal care.  Vitals not obtained d/t patient not being an in-person visit.   Tina Crutch, RN 01/10/2023  2:49 PM

## 2023-01-12 ENCOUNTER — Other Ambulatory Visit: Payer: Self-pay

## 2023-01-12 DIAGNOSIS — Z3200 Encounter for pregnancy test, result unknown: Secondary | ICD-10-CM

## 2023-01-12 NOTE — Progress Notes (Signed)
Encounter opened in error

## 2023-01-14 ENCOUNTER — Ambulatory Visit (HOSPITAL_COMMUNITY)
Admission: RE | Admit: 2023-01-14 | Discharge: 2023-01-14 | Disposition: A | Discharge status: Home / Self Care | Payer: Medicaid Other | Source: Ambulatory Visit | Attending: Obstetrics & Gynecology | Admitting: Obstetrics & Gynecology

## 2023-01-14 DIAGNOSIS — O0991 Supervision of high risk pregnancy, unspecified, first trimester: Secondary | ICD-10-CM | POA: Diagnosis not present

## 2023-01-14 DIAGNOSIS — Z3A01 Less than 8 weeks gestation of pregnancy: Secondary | ICD-10-CM | POA: Diagnosis not present

## 2023-01-14 DIAGNOSIS — Z3689 Encounter for other specified antenatal screening: Secondary | ICD-10-CM | POA: Diagnosis not present

## 2023-01-19 ENCOUNTER — Telehealth: Payer: Self-pay | Admitting: Family Medicine

## 2023-01-19 DIAGNOSIS — O3680X0 Pregnancy with inconclusive fetal viability, not applicable or unspecified: Secondary | ICD-10-CM

## 2023-01-19 NOTE — Telephone Encounter (Signed)
Patient need follow up U/S to where we sent her the first time.

## 2023-01-21 NOTE — Telephone Encounter (Signed)
Patient called again to front desk wanting to discuss [place] results with nurse. I reviewed [place] results with Dr. Alvester Morin who advised repeat US in 2 weeks. I called Larya and she states she sees the results and wants to get the [place] scheduled asap. I gave her a choice of having US done in our office first available 02/08/23 or same place she had last time. She states she wants it sooner and I informed her I will schedule and call her back. I scheduled for 01/28/23 at 0800 at [facility] where she had last [place]. I called Lamiyah and left message for her of appointment and to call if questions. Nancy Fetter

## 2023-01-21 NOTE — Addendum Note (Signed)
Addended by: Gerome Apley on: 01/21/2023 11:21 AM   Modules accepted: Orders

## 2023-01-22 DIAGNOSIS — Z419 Encounter for procedure for purposes other than remedying health state, unspecified: Secondary | ICD-10-CM | POA: Diagnosis not present

## 2023-01-26 ENCOUNTER — Telehealth: Payer: Self-pay | Admitting: Family Medicine

## 2023-01-26 NOTE — Telephone Encounter (Signed)
Patient would like some nausea medication prescribed, would like a call back from a nurse

## 2023-01-27 NOTE — Telephone Encounter (Signed)
Left message for pt stating that the medication that she is requesting was sent to her [facility] on [address] with one refill.  If she has any questions to please give the office a call.   Tina Phillips  01/27/23

## 2023-01-28 ENCOUNTER — Ambulatory Visit (HOSPITAL_COMMUNITY)
Admission: RE | Admit: 2023-01-28 | Discharge: 2023-01-28 | Disposition: A | Discharge status: Home / Self Care | Payer: Medicaid Other | Source: Ambulatory Visit | Attending: Family Medicine | Admitting: Family Medicine

## 2023-01-28 ENCOUNTER — Other Ambulatory Visit: Payer: Self-pay | Admitting: Family Medicine

## 2023-01-28 DIAGNOSIS — O3680X0 Pregnancy with inconclusive fetal viability, not applicable or unspecified: Secondary | ICD-10-CM | POA: Insufficient documentation

## 2023-01-28 DIAGNOSIS — O208 Other hemorrhage in early pregnancy: Secondary | ICD-10-CM | POA: Diagnosis not present

## 2023-01-28 DIAGNOSIS — Z3A01 Less than 8 weeks gestation of pregnancy: Secondary | ICD-10-CM | POA: Diagnosis not present

## 2023-02-21 DIAGNOSIS — Z419 Encounter for procedure for purposes other than remedying health state, unspecified: Secondary | ICD-10-CM | POA: Diagnosis not present

## 2023-02-21 DIAGNOSIS — Z3A09 9 weeks gestation of pregnancy: Secondary | ICD-10-CM | POA: Diagnosis not present

## 2023-02-21 DIAGNOSIS — O034 Incomplete spontaneous abortion without complication: Secondary | ICD-10-CM | POA: Diagnosis not present

## 2023-02-21 DIAGNOSIS — O26899 Other specified pregnancy related conditions, unspecified trimester: Secondary | ICD-10-CM | POA: Diagnosis not present

## 2023-02-21 DIAGNOSIS — O039 Complete or unspecified spontaneous abortion without complication: Secondary | ICD-10-CM | POA: Diagnosis not present

## 2023-02-21 DIAGNOSIS — D649 Anemia, unspecified: Secondary | ICD-10-CM | POA: Diagnosis not present

## 2023-02-21 DIAGNOSIS — O209 Hemorrhage in early pregnancy, unspecified: Secondary | ICD-10-CM | POA: Diagnosis not present

## 2023-02-21 DIAGNOSIS — Z86718 Personal history of other venous thrombosis and embolism: Secondary | ICD-10-CM | POA: Diagnosis not present

## 2023-02-21 DIAGNOSIS — O99111 Other diseases of the blood and blood-forming organs and certain disorders involving the immune mechanism complicating pregnancy, first trimester: Secondary | ICD-10-CM | POA: Diagnosis not present

## 2023-02-21 DIAGNOSIS — O99011 Anemia complicating pregnancy, first trimester: Secondary | ICD-10-CM | POA: Diagnosis not present

## 2023-02-21 DIAGNOSIS — D693 Immune thrombocytopenic purpura: Secondary | ICD-10-CM | POA: Diagnosis not present

## 2023-02-21 DIAGNOSIS — N854 Malposition of uterus: Secondary | ICD-10-CM | POA: Diagnosis not present

## 2023-02-21 DIAGNOSIS — Z3A00 Weeks of gestation of pregnancy not specified: Secondary | ICD-10-CM | POA: Diagnosis not present

## 2023-02-21 DIAGNOSIS — R8281 Pyuria: Secondary | ICD-10-CM | POA: Diagnosis not present

## 2023-03-07 DIAGNOSIS — F339 Major depressive disorder, recurrent, unspecified: Secondary | ICD-10-CM | POA: Diagnosis not present

## 2023-03-07 DIAGNOSIS — F419 Anxiety disorder, unspecified: Secondary | ICD-10-CM | POA: Diagnosis not present

## 2023-03-24 DIAGNOSIS — Z419 Encounter for procedure for purposes other than remedying health state, unspecified: Secondary | ICD-10-CM | POA: Diagnosis not present

## 2023-04-03 DIAGNOSIS — Z789 Other specified health status: Secondary | ICD-10-CM | POA: Diagnosis not present

## 2023-04-03 DIAGNOSIS — R9431 Abnormal electrocardiogram [ECG] [EKG]: Secondary | ICD-10-CM | POA: Diagnosis not present

## 2023-04-03 DIAGNOSIS — R918 Other nonspecific abnormal finding of lung field: Secondary | ICD-10-CM | POA: Diagnosis not present

## 2023-04-03 DIAGNOSIS — R0989 Other specified symptoms and signs involving the circulatory and respiratory systems: Secondary | ICD-10-CM | POA: Diagnosis not present

## 2023-04-03 DIAGNOSIS — R0789 Other chest pain: Secondary | ICD-10-CM | POA: Diagnosis not present

## 2023-04-03 DIAGNOSIS — R079 Chest pain, unspecified: Secondary | ICD-10-CM | POA: Diagnosis not present

## 2023-04-03 DIAGNOSIS — R0781 Pleurodynia: Secondary | ICD-10-CM | POA: Diagnosis not present

## 2023-04-03 DIAGNOSIS — I10 Essential (primary) hypertension: Secondary | ICD-10-CM | POA: Diagnosis not present

## 2023-04-03 DIAGNOSIS — I517 Cardiomegaly: Secondary | ICD-10-CM | POA: Diagnosis not present

## 2023-04-03 DIAGNOSIS — J90 Pleural effusion, not elsewhere classified: Secondary | ICD-10-CM | POA: Diagnosis not present

## 2023-04-04 DIAGNOSIS — F339 Major depressive disorder, recurrent, unspecified: Secondary | ICD-10-CM | POA: Diagnosis not present

## 2023-04-04 DIAGNOSIS — F419 Anxiety disorder, unspecified: Secondary | ICD-10-CM | POA: Diagnosis not present

## 2023-04-24 DIAGNOSIS — Z419 Encounter for procedure for purposes other than remedying health state, unspecified: Secondary | ICD-10-CM | POA: Diagnosis not present

## 2023-06-14 DIAGNOSIS — F419 Anxiety disorder, unspecified: Secondary | ICD-10-CM | POA: Diagnosis not present

## 2023-06-14 DIAGNOSIS — J22 Unspecified acute lower respiratory infection: Secondary | ICD-10-CM | POA: Diagnosis not present

## 2023-06-14 DIAGNOSIS — G44209 Tension-type headache, unspecified, not intractable: Secondary | ICD-10-CM | POA: Diagnosis not present

## 2023-06-27 ENCOUNTER — Emergency Department (HOSPITAL_COMMUNITY)
Admission: EM | Admit: 2023-06-27 | Discharge: 2023-06-27 | Disposition: A | Discharge status: Home / Self Care | Payer: Medicaid Other

## 2023-06-27 ENCOUNTER — Encounter (HOSPITAL_COMMUNITY): Payer: Self-pay

## 2023-06-27 ENCOUNTER — Other Ambulatory Visit: Payer: Self-pay

## 2023-06-27 DIAGNOSIS — R109 Unspecified abdominal pain: Secondary | ICD-10-CM | POA: Insufficient documentation

## 2023-06-27 LAB — COMPREHENSIVE METABOLIC PANEL
ALT: 41 U/L (ref 0–44)
AST: 55 U/L — ABNORMAL HIGH (ref 15–41)
Albumin: 3.8 g/dL (ref 3.5–5.0)
Alkaline Phosphatase: 68 U/L (ref 38–126)
Anion gap: 10 (ref 5–15)
BUN: 10 mg/dL (ref 6–20)
CO2: 25 mmol/L (ref 22–32)
Calcium: 9.7 mg/dL (ref 8.9–10.3)
Chloride: 101 mmol/L (ref 98–111)
Creatinine, Ser: 0.84 mg/dL (ref 0.44–1.00)
GFR, Estimated: >60 mL/min (ref >60)
Glucose, Bld: 100 mg/dL — ABNORMAL HIGH (ref 70–99)
Potassium: 4 mmol/L (ref 3.5–5.1)
Sodium: 136 mmol/L (ref 135–145)
Total Bilirubin: 0.5 mg/dL (ref <1.2)
Total Protein: 7.1 g/dL (ref 6.5–8.1)

## 2023-06-27 LAB — HCG, QUANTITATIVE, PREGNANCY: hCG, Beta Chain, Quant, S: 1 m[IU]/mL (ref <5)

## 2023-06-27 LAB — CBC
HCT: 38.4 % (ref 36.0–46.0)
Hemoglobin: 11.3 g/dL — ABNORMAL LOW (ref 12.0–15.0)
MCH: 25.1 pg — ABNORMAL LOW (ref 26.0–34.0)
MCHC: 29.4 g/dL — ABNORMAL LOW (ref 30.0–36.0)
MCV: 85.3 fL (ref 80.0–100.0)
Platelets: 432 10*3/uL — ABNORMAL HIGH (ref 150–400)
RBC: 4.5 MIL/uL (ref 3.87–5.11)
RDW: 14.9 % (ref 11.5–15.5)
WBC: 8.5 10*3/uL (ref 4.0–10.5)
nRBC: 0 % (ref 0.0–0.2)

## 2023-06-27 LAB — LIPASE, BLOOD: Lipase: 26 U/L (ref 11–51)

## 2023-06-27 LAB — HCG, SERUM, QUALITATIVE: Preg, Serum: NEGATIVE

## 2023-06-27 MED ORDER — ACETAMINOPHEN 500 MG PO TABS
1000.0000 mg | ORAL_TABLET | Freq: Once | ORAL | Status: AC
  Administered 2023-06-27: 1000 mg via ORAL
  Filled 2023-06-27: qty 2

## 2023-06-27 MED ORDER — KETOROLAC TROMETHAMINE 15 MG/ML IJ SOLN: 15.0000 mg | Freq: Once | INTRAMUSCULAR | Status: DC

## 2023-06-27 MED ORDER — KETOROLAC TROMETHAMINE 15 MG/ML IJ SOLN
15.0000 mg | Freq: Once | INTRAMUSCULAR | Status: DC
  Filled 2023-06-27: qty 1

## 2023-06-27 NOTE — Discharge Instructions (Signed)
Patient for abdominal pain was overall reassuring.  Please follow-up with your PCP.  If you develop nausea vomiting diarrhea, blood in the urine, painful urination, fever, worsening abdominal pain or any other concerning symptom please return emerged part further evaluation.

## 2023-06-27 NOTE — ED Triage Notes (Signed)
Pt reports abdominal pain and back pain starting at 0600 pt took hydrocodone at home and it brought pain down to a 5 but pain has increased to a 9. Pt reports having a recent miscarriage  and D&C and the pain has been on and off beginning at that time

## 2023-06-27 NOTE — ED Provider Notes (Signed)
[place] EMERGENCY DEPARTMENT AT [facility] - Inpatient Provider Note   CSN: [id number] Arrival date & time: 06/27/23  1224     History  Chief Complaint  Patient presents with   Abdominal Pain   Back Pain   HPI Tina Phillips is a 32 y.o. female with D&C status post miscarriage in June of this year, history of DVT presenting for abdominal pain and back pain.  Started this morning around 6 AM.  Abdominal pain is all over the upper abdomen.  Reports lower back pain that is atraumatic as well.  Denies associated nausea, vomiting diarrhea.  Denies urinary symptoms.  Denies abnormal vaginal discharge or bleeding.  States the pain was severe at first but has definitely improved.  Denies fever and chills.  States she had a normal bowel movement this morning.   Abdominal Pain Back Pain Associated symptoms: abdominal pain        Home Medications Prior to Admission medications   Medication Sig Start Date End Date Taking? Authorizing Provider  ibuprofen (ADVIL) 600 MG tablet Take 1 tablet (600 mg total) by mouth every 6 (six) hours. Patient not taking: Reported on 01/10/2023 06/02/22   Carlyon Shadow, DO  Prenatal Vit-Fe Fumarate-FA (PRENATAL 1+1 PO) Prenatal 1    [provider]  promethazine (PHENERGAN) 25 MG tablet Take 1 tablet (25 mg total) by mouth every 6 (six) hours as needed for nausea or vomiting. 01/10/23   Adam Phenix, MD  sertraline (ZOLOFT) 25 MG tablet Take 1 tablet (25 mg total) by mouth daily. 09/09/22 10/09/22  Elsie Lincoln, MD      Allergies    Patient has no known allergies.    Review of Systems   Review of Systems  Gastrointestinal:  Positive for abdominal pain.  Musculoskeletal:  Positive for back pain.    Physical Exam Updated Vital Signs BP 122/80 (BP Location: Left Arm)   Pulse 70   Temp 97.6 F (36.4 C) (Oral)   Resp 17   Ht 5\' 7"  (1.702 m)   Wt 104.3 kg   LMP 06/20/2023   SpO2 100%   BMI 36.02 kg/m  Physical Exam Vitals and  nursing note reviewed.  HENT:     Head: Normocephalic and atraumatic.     Mouth/Throat:     Mouth: Mucous membranes are moist.  Eyes:     General:        Right eye: No discharge.        Left eye: No discharge.     Conjunctiva/sclera: Conjunctivae normal.  Cardiovascular:     Rate and Rhythm: Normal rate and regular rhythm.     Pulses: Normal pulses.     Heart sounds: Normal heart sounds.  Pulmonary:     Effort: Pulmonary effort is normal.     Breath sounds: Normal breath sounds.  Abdominal:     General: Abdomen is flat.     Palpations: Abdomen is soft.     Tenderness: There is no abdominal tenderness. There is no right CVA tenderness or left CVA tenderness.  Skin:    General: Skin is warm and dry.  Neurological:     General: No focal deficit present.  Psychiatric:        Mood and Affect: Mood normal.     ED Results / Procedures / Treatments   Labs (all labs ordered are listed, but only abnormal results are displayed) Labs Reviewed  COMPREHENSIVE METABOLIC PANEL - Abnormal; Notable for the following components:  Result Value   Glucose, Bld 100 (*)    AST 55 (*)    All other components within normal limits  CBC - Abnormal; Notable for the following components:   Hemoglobin 11.3 (*)    MCH 25.1 (*)    MCHC 29.4 (*)    Platelets 432 (*)    All other components within normal limits  LIPASE, BLOOD  HCG, SERUM, QUALITATIVE  HCG, QUANTITATIVE, PREGNANCY  URINALYSIS, ROUTINE W REFLEX MICROSCOPIC    EKG None  Radiology No results found.  Procedures Procedures    Medications Ordered in ED Medications  ketorolac (TORADOL) 15 MG/ML injection 15 mg (has no administration in time range)  acetaminophen (TYLENOL) tablet 1,000 mg (has no administration in time range)    ED Course/ Medical Decision Making/ A&P                                 Medical Decision Making Amount and/or Complexity of Data Reviewed Labs: ordered.   32 year old well-appearing female  presenting for abdominal pain.  Exam was unremarkable.  DDx includes appendicitis, acute cholecystitis pancreatitis, nephrolithiasis pyelonephritis, PID, TOA, other.  Overall patient is well-appearing and nontoxic.  Have low suspicion for infection given nontender abdomen reassuring labs and no fever.  Symptoms had dramatically improved as well between this morning and my encounter with her without intervention.  States she did have a little bit of abdominal discomfort for this reason treated her with Toradol and Tylenol.  Labs were unremarkable.  Advised to follow-up with her PCP for ongoing abdominal pain.  Vital stable.  Discharged home in good condition.  Also discussed return precautions.        Final Clinical Impression(s) / ED Diagnoses Final diagnoses:  Abdominal pain, unspecified abdominal location    Rx / DC Orders ED Discharge Orders     None         Gareth Eagle, PA-C 06/27/23 1735    Durwin Glaze, MD 06/28/23 [phone]

## 2023-07-04 ENCOUNTER — Ambulatory Visit: Payer: Self-pay | Admitting: Obstetrics and Gynecology

## 2023-08-31 ENCOUNTER — Ambulatory Visit: Payer: Self-pay | Admitting: Obstetrics and Gynecology

## 2023-08-31 NOTE — Progress Notes (Deleted)
 ANNUAL EXAM Patient name: Tina Phillips MRN [id number]  Date of birth: [birth year] Chief Complaint:   No chief complaint on file.  History of Present Illness:   Tina Phillips is a 33 y.o. G38P1001 {race:25618} female being seen today for a routine annual exam.   Current complaints: ***  No LMP recorded.   The pregnancy intention screening data noted above was reviewed. Potential methods of contraception were discussed. The patient elected to proceed with No data recorded.    Last pap 06/04/2020-ASC-US /HRHPV positive (non-16/18). Last mammogram: ***. Results were: N/A. Family h/o breast cancer: {yes***/no:23838} Last colonoscopy: ***. Results were: N/A. Family h/o colorectal cancer: {yes***/no:23838}     08/03/2022    2:40 PM 05/27/2022    3:17 PM 05/20/2022    5:19 PM 05/07/2022   10:00 AM 01/13/2022    4:22 PM  Depression screen PHQ 2/9  Decreased Interest 2 0 0 0 3  Down, Depressed, Hopeless 2 0 0 1 1  PHQ - 2 Score 4 0 0 1 4  Altered sleeping 3 0 0 2 0  Tired, decreased energy 1 0 0 1 0  Change in appetite 1 0 0 2 0  Feeling bad or failure about yourself  1 0 0 0 0  Trouble concentrating 0 0 0 0 0  Moving slowly or fidgety/restless 0 0 0 0 0  Suicidal thoughts 0 0 0 0 0  PHQ-9 Score 10 0 0 6 4        08/03/2022    2:43 PM 05/27/2022    3:16 PM 05/20/2022    5:20 PM 05/07/2022   10:01 AM  GAD 7 : Generalized Anxiety Score  Nervous, Anxious, on Edge 1 0 0 0  Control/stop worrying 0 0 0 0  Worry too much - different things 3 0 0 0  Trouble relaxing 0 0 0 0  Restless 0 0 0 0  Easily annoyed or irritable 1 0 0 0  Afraid - awful might happen 1 0 0 0  Total GAD 7 Score 6 0 0 0     Review of Systems:   Pertinent items are noted in HPI Denies any headaches, blurred vision, fatigue, shortness of breath, chest pain, abdominal pain, abnormal vaginal discharge/itching/odor/irritation, problems with periods, bowel movements, urination, or intercourse unless otherwise  stated above. Pertinent History Reviewed:  Reviewed past medical,surgical, social and family history.  Reviewed problem list, medications and allergies. Physical Assessment:  There were no vitals filed for this visit.There is no height or weight on file to calculate BMI.        Physical Examination:   General appearance - well appearing, and in no distress  Mental status - alert, oriented to person, place, and time  Psych:  She has a normal mood and affect  Skin - warm and dry, normal color, no suspicious lesions noted  Chest - effort normal, all lung fields clear to auscultation bilaterally  Heart - normal rate and regular rhythm  Neck:  midline trachea, no thyromegaly or nodules  Breasts - breasts appear normal, no suspicious masses, no skin or nipple changes or  axillary nodes  Abdomen - soft, nontender, nondistended, no masses or organomegaly  Pelvic - VULVA: normal appearing vulva with no masses, tenderness or lesions  VAGINA: normal appearing vagina with normal color and discharge, no lesions  CERVIX: normal appearing cervix without discharge or lesions, no CMT  Thin prep pap is {Desc; done/not:10129} *** HR HPV cotesting  UTERUS: uterus  is felt to be normal size, shape, consistency and nontender   ADNEXA: No adnexal masses or tenderness noted.  Extremities:  No swelling or varicosities noted  Chaperone present for exam  No results found for this or any previous visit (from the past 24 hours).  Assessment & Plan:    Mammogram: {Mammo f/u:25212::@ 33yo}, or sooner if problems Colonoscopy: {TCS f/u:25213::@ 33yo}, or sooner if problems  No orders of the defined types were placed in this encounter.   Meds: No orders of the defined types were placed in this encounter.   Follow-up: No follow-ups on file.  Kalany Diekmann E Radley Barto, PA-C 08/31/2023 8:32 AM

## 2023-09-04 ENCOUNTER — Emergency Department (HOSPITAL_BASED_OUTPATIENT_CLINIC_OR_DEPARTMENT_OTHER): Payer: 59

## 2023-09-04 ENCOUNTER — Encounter (HOSPITAL_BASED_OUTPATIENT_CLINIC_OR_DEPARTMENT_OTHER): Payer: Self-pay | Admitting: Emergency Medicine

## 2023-09-04 ENCOUNTER — Emergency Department (HOSPITAL_BASED_OUTPATIENT_CLINIC_OR_DEPARTMENT_OTHER)
Admission: EM | Admit: 2023-09-04 | Discharge: 2023-09-04 | Disposition: A | Discharge status: Home / Self Care | Payer: 59 | Attending: Emergency Medicine | Admitting: Emergency Medicine

## 2023-09-04 ENCOUNTER — Other Ambulatory Visit: Payer: Self-pay

## 2023-09-04 DIAGNOSIS — O99351 Diseases of the nervous system complicating pregnancy, first trimester: Secondary | ICD-10-CM | POA: Insufficient documentation

## 2023-09-04 DIAGNOSIS — R1084 Generalized abdominal pain: Secondary | ICD-10-CM | POA: Insufficient documentation

## 2023-09-04 DIAGNOSIS — O26891 Other specified pregnancy related conditions, first trimester: Secondary | ICD-10-CM | POA: Insufficient documentation

## 2023-09-04 DIAGNOSIS — G43809 Other migraine, not intractable, without status migrainosus: Secondary | ICD-10-CM | POA: Diagnosis not present

## 2023-09-04 DIAGNOSIS — Z3A01 Less than 8 weeks gestation of pregnancy: Secondary | ICD-10-CM | POA: Diagnosis not present

## 2023-09-04 LAB — CBC WITH DIFFERENTIAL/PLATELET
Abs Immature Granulocytes: 0.02 10*3/uL (ref 0.00–0.07)
Basophils Absolute: 0 10*3/uL (ref 0.0–0.1)
Basophils Relative: 1 %
Eosinophils Absolute: 0.1 10*3/uL (ref 0.0–0.5)
Eosinophils Relative: 1 %
HCT: 36.3 % (ref 36.0–46.0)
Hemoglobin: 11 g/dL — ABNORMAL LOW (ref 12.0–15.0)
Immature Granulocytes: 0 %
Lymphocytes Relative: 34 %
Lymphs Abs: 2.6 10*3/uL (ref 0.7–4.0)
MCH: 25.8 pg — ABNORMAL LOW (ref 26.0–34.0)
MCHC: 30.3 g/dL (ref 30.0–36.0)
MCV: 85 fL (ref 80.0–100.0)
Monocytes Absolute: 0.6 10*3/uL (ref 0.1–1.0)
Monocytes Relative: 9 %
Neutro Abs: 4.1 10*3/uL (ref 1.7–7.7)
Neutrophils Relative %: 55 %
Platelets: 267 10*3/uL (ref 150–400)
RBC: 4.27 MIL/uL (ref 3.87–5.11)
RDW: 15.9 % — ABNORMAL HIGH (ref 11.5–15.5)
WBC: 7.5 10*3/uL (ref 4.0–10.5)
nRBC: 0 % (ref 0.0–0.2)

## 2023-09-04 LAB — COMPREHENSIVE METABOLIC PANEL
ALT: 10 U/L (ref 0–44)
AST: 15 U/L (ref 15–41)
Albumin: 4.3 g/dL (ref 3.5–5.0)
Alkaline Phosphatase: 67 U/L (ref 38–126)
Anion gap: 7 (ref 5–15)
BUN: 11 mg/dL (ref 6–20)
CO2: 26 mmol/L (ref 22–32)
Calcium: 9.4 mg/dL (ref 8.9–10.3)
Chloride: 106 mmol/L (ref 98–111)
Creatinine, Ser: 0.89 mg/dL (ref 0.44–1.00)
GFR, Estimated: >60 mL/min (ref >60)
Glucose, Bld: 102 mg/dL — ABNORMAL HIGH (ref 70–99)
Potassium: 3.9 mmol/L (ref 3.5–5.1)
Sodium: 139 mmol/L (ref 135–145)
Total Bilirubin: 0.5 mg/dL (ref 0.0–1.2)
Total Protein: 7 g/dL (ref 6.5–8.1)

## 2023-09-04 LAB — URINALYSIS, ROUTINE W REFLEX MICROSCOPIC
Bilirubin Urine: NEGATIVE
Glucose, UA: NEGATIVE mg/dL
Hgb urine dipstick: NEGATIVE
Ketones, ur: NEGATIVE mg/dL
Leukocytes,Ua: NEGATIVE
Nitrite: NEGATIVE
Specific Gravity, Urine: 1.03 (ref 1.005–1.030)
pH: 6 (ref 5.0–8.0)

## 2023-09-04 LAB — HCG, QUANTITATIVE, PREGNANCY: hCG, Beta Chain, Quant, S: 83 m[IU]/mL — ABNORMAL HIGH (ref <5)

## 2023-09-04 LAB — PREGNANCY, URINE: Preg Test, Ur: POSITIVE — AB

## 2023-09-04 LAB — LIPASE, BLOOD: Lipase: <10 U/L — ABNORMAL LOW (ref 11–51)

## 2023-09-04 MED ORDER — IOHEXOL 300 MG/ML  SOLN: 100.0000 mL | Freq: Once | INTRAMUSCULAR | Status: DC | PRN

## 2023-09-04 MED ORDER — DIPHENHYDRAMINE HCL 50 MG/ML IJ SOLN
25.0000 mg | Freq: Once | INTRAMUSCULAR | Status: AC
  Administered 2023-09-04: 25 mg via INTRAVENOUS
  Filled 2023-09-04: qty 1

## 2023-09-04 MED ORDER — SODIUM CHLORIDE 0.9 % IV BOLUS
1000.0000 mL | Freq: Once | INTRAVENOUS | Status: AC
  Administered 2023-09-04: 1000 mL via INTRAVENOUS

## 2023-09-04 MED ORDER — METOCLOPRAMIDE HCL 5 MG/ML IJ SOLN
10.0000 mg | Freq: Once | INTRAMUSCULAR | Status: AC
  Administered 2023-09-04: 10 mg via INTRAVENOUS
  Filled 2023-09-04: qty 2

## 2023-09-04 MED ORDER — DEXAMETHASONE SODIUM PHOSPHATE 10 MG/ML IJ SOLN
10.0000 mg | Freq: Once | INTRAMUSCULAR | Status: AC
  Administered 2023-09-04: 10 mg via INTRAVENOUS
  Filled 2023-09-04: qty 1

## 2023-09-04 NOTE — ED Triage Notes (Signed)
 Abdominal pain,diffuse, has been ongoing since miscarriage a month ago, worse today. No nausea or vomiting

## 2023-09-04 NOTE — ED Notes (Signed)
 Dc instructions reviewed with patient. Patient voiced understanding. Dc with belongings.

## 2023-09-04 NOTE — ED Provider Notes (Addendum)
 [place] EMERGENCY DEPARTMENT AT [facility] Provider Note   CSN: [id number] Arrival date & time: 09/04/23  1123     History  No chief complaint on file.   Tina Phillips is a 33 y.o. female.  Patient here for right sided posterior headache patient's had trouble with which caused severe headaches ever since she was a teenager.  But never formally diagnosed with migraines.  Patient was told 1 time before she has a pinched nerve in her neck.  But does not really have any upper extremity symptoms currently.  Sometimes she gets some numbness in both hands.  No neck pain here today.  Patient also has been having trouble with ongoing abdominal pain that is kind of across the periumbilical area will last for about an hour and resolved.  This been ongoing since her miscarriage.  She has not followed back up with her OB/GYN doctors since the onset of this pain.  But says her menstrual periods have been fairly regular.  With a headache there is no visual changes there is a strange smell there is no nausea or vomiting.  Patient's had a bout of that abdominal pain about an hour prior to arrival it is subsiding significantly but has not resolved completely.  There is not association between the abdominal pain and the headache.       Home Medications Prior to Admission medications   Medication Sig Start Date End Date Taking? Authorizing Provider  ibuprofen  (ADVIL ) 600 MG tablet Take 1 tablet (600 mg total) by mouth every 6 (six) hours. Patient not taking: Reported on 01/10/2023 06/02/22   Haig Stagger, DO  Prenatal Vit-Fe Fumarate-FA (PRENATAL 1+1 PO) Prenatal 1    [provider]  promethazine  (PHENERGAN ) 25 MG tablet Take 1 tablet (25 mg total) by mouth every 6 (six) hours as needed for nausea or vomiting. 01/10/23   Eveline Lynwood MATSU, MD  sertraline  (ZOLOFT ) 25 MG tablet Take 1 tablet (25 mg total) by mouth daily. 09/09/22 10/09/22  Barbra Jayson DELENA, MD      Allergies     Patient has no known allergies.    Review of Systems   Review of Systems  Constitutional:  Negative for chills and fever.  HENT:  Negative for ear pain and sore throat.   Eyes:  Negative for pain and visual disturbance.  Respiratory:  Negative for cough and shortness of breath.   Cardiovascular:  Negative for chest pain and palpitations.  Gastrointestinal:  Positive for abdominal pain. Negative for vomiting.  Genitourinary:  Negative for dysuria and hematuria.  Musculoskeletal:  Negative for arthralgias and back pain.  Skin:  Negative for color change and rash.  Neurological:  Positive for headaches. Negative for seizures and syncope.  All other systems reviewed and are negative.   Physical Exam Updated Vital Signs BP 118/82   Pulse 63   Temp 97.8 F (36.6 C) (Oral)   Resp 17   Wt 102.1 kg   LMP  (LMP Unknown)   SpO2 98%   BMI 35.24 kg/m  Physical Exam Vitals and nursing note reviewed.  Constitutional:      General: She is not in acute distress.    Appearance: Normal appearance. She is well-developed.  HENT:     Head: Normocephalic and atraumatic.     Mouth/Throat:     Mouth: Mucous membranes are moist.  Eyes:     Extraocular Movements: Extraocular movements intact.     Conjunctiva/sclera: Conjunctivae normal.  Pupils: Pupils are equal, round, and reactive to light.  Cardiovascular:     Rate and Rhythm: Normal rate and regular rhythm.     Heart sounds: No murmur heard. Pulmonary:     Effort: Pulmonary effort is normal. No respiratory distress.     Breath sounds: Normal breath sounds.  Abdominal:     General: There is no distension.     Palpations: Abdomen is soft.     Tenderness: There is no abdominal tenderness. There is no guarding.  Musculoskeletal:        General: No swelling.     Cervical back: Normal range of motion and neck supple.  Skin:    General: Skin is warm and dry.     Capillary Refill: Capillary refill takes less than 2 seconds.   Neurological:     General: No focal deficit present.     Mental Status: She is alert and oriented to person, place, and time.     Cranial Nerves: No cranial nerve deficit.     Sensory: No sensory deficit.     Motor: No weakness.  Psychiatric:        Mood and Affect: Mood normal.     ED Results / Procedures / Treatments   Labs (all labs ordered are listed, but only abnormal results are displayed) Labs Reviewed  PREGNANCY, URINE - Abnormal; Notable for the following components:      Result Value   Preg Test, Ur POSITIVE (*)    All other components within normal limits  URINALYSIS, ROUTINE W REFLEX MICROSCOPIC - Abnormal; Notable for the following components:   Protein, ur TRACE (*)    All other components within normal limits  CBC WITH DIFFERENTIAL/PLATELET - Abnormal; Notable for the following components:   Hemoglobin 11.0 (*)    MCH 25.8 (*)    RDW 15.9 (*)    All other components within normal limits  LIPASE, BLOOD - Abnormal; Notable for the following components:   Lipase <10 (*)    All other components within normal limits  COMPREHENSIVE METABOLIC PANEL - Abnormal; Notable for the following components:   Glucose, Bld 102 (*)    All other components within normal limits  HCG, QUANTITATIVE, PREGNANCY - Abnormal; Notable for the following components:   hCG, Beta Chain, Quant, S 83 (*)    All other components within normal limits    EKG None  Radiology No results found.  Procedures Procedures    Medications Ordered in ED Medications  sodium chloride  0.9 % bolus 1,000 mL (0 mLs Intravenous Stopped 09/04/23 1358)  dexamethasone  (DECADRON ) injection 10 mg (10 mg Intravenous Given 09/04/23 1229)  metoCLOPramide  (REGLAN ) injection 10 mg (10 mg Intravenous Given 09/04/23 1229)  diphenhydrAMINE  (BENADRYL ) injection 25 mg (25 mg Intravenous Given 09/04/23 1229)    ED Course/ Medical Decision Making/ A&P                                 Medical Decision Making Amount  and/or Complexity of Data Reviewed Labs: ordered. Radiology: ordered.  Risk Prescription drug management.   No significant tenderness on abdominal exam.  Will treat for probable migraine with migraine cocktail.  Do not feel she needs a CT head.  But patient was seen November 5 for this at the [facility] ED with abdominal pain and did not have a CAT scan at that time.  So we will order that here today.  And will  get abdominal labs as well.  Obviously we need to rule out she is not pregnant.  So urine pregnancy test is significant.  If workup is all negative on the abdomen would recommend that there was some input from her OB/GYN doctor as well.  Although may not be related to the miscarriage that she had back in the fall.  Surprisingly patient's pregnancy test positive today.  Definitely had negative pregnancy test November 4.  Was seen in July at [facility] for concerns for retained products of conception.  It appears that patient has not seen OB/GYN since that time.  Urinalysis here today without evidence of urinary tract infection.  Complete metabolic panel is normal including renal function LFTs normal.  Lipase less than 10 white count 7.5 hemoglobin 11.0 platelets are 267.  Ultrasound for OB less than 14 weeks is pending.  Patient states headaches a little bit improved but obviously not gone.  Would expect maybe the Decadron  will kick in and take care of that.  Patient's ultrasound did not see an intrauterine fetal pole or yolk sac.  So the location of pregnancy undetermined but her quantitative hCG is extremely low.  So close follow-up with OB/GYN will be important.  Patient is followed by OB/GYN.  Obviously cannot rule out an ectopic at this time.  But she is in no acute distress.  Feel that the abdominal pain that she has been having is unrelated to the previous miscarriage or even this pregnancy.  Also it is unlikely that there is any continued retained products of conception  since she had negative pregnancy test in November.  And patient was seen in July at [facility] with concerns for retained products.  Has not really seen her OB/GYN recently.  Also no vaginal bleeding at this time.   Final Clinical Impression(s) / ED Diagnoses Final diagnoses:  Other migraine without status migrainosus, not intractable  Generalized abdominal pain  Less than [redacted] weeks gestation of pregnancy    Rx / DC Orders ED Discharge Orders     None         Geraldene Hamilton, MD 09/04/23 1219    Geraldene Hamilton, MD 09/04/23 1524    Geraldene Hamilton, MD 09/04/23 1532    Geraldene Hamilton, MD 09/04/23 1534    Geraldene Hamilton, MD 09/04/23 1537

## 2023-09-04 NOTE — Discharge Instructions (Signed)
 Make an appointment to follow back up with your OB/GYN.  You saw Dr. Eveline last spring.  Return for any new or worse symptoms including worse abdominal pain or heavy vaginal bleeding.  As we discussed your pregnancy test positive here today.  Will need a repeat quantitative hCG sometime in the next few days.

## 2023-09-06 ENCOUNTER — Other Ambulatory Visit: Payer: Self-pay

## 2023-09-06 ENCOUNTER — Other Ambulatory Visit (HOSPITAL_COMMUNITY)
Admission: RE | Admit: 2023-09-06 | Discharge: 2023-09-06 | Disposition: A | Discharge status: Home / Self Care | Payer: 59 | Source: Ambulatory Visit | Attending: Family Medicine | Admitting: Family Medicine

## 2023-09-06 ENCOUNTER — Ambulatory Visit: Payer: 59

## 2023-09-06 VITALS — BP 108/74 | HR 79 | Wt 238.0 lb

## 2023-09-06 DIAGNOSIS — R102 Pelvic and perineal pain: Secondary | ICD-10-CM

## 2023-09-06 DIAGNOSIS — Z3A00 Weeks of gestation of pregnancy not specified: Secondary | ICD-10-CM

## 2023-09-06 DIAGNOSIS — O3680X0 Pregnancy with inconclusive fetal viability, not applicable or unspecified: Secondary | ICD-10-CM

## 2023-09-06 LAB — BETA HCG QUANT (REF LAB): hCG Quant: 29 m[IU]/mL

## 2023-09-06 NOTE — Progress Notes (Signed)
 Beta HCG Follow-up Visit  Tina Phillips presents to [facility] for follow-up beta HCG lab. She was seen at ED ([facility]) on 09/04/23 for headache and abdominal pain. Pt unaware she was pregnant until ED visit. No IUP seen on US . New vaginal bleeding starting yesterday with clots. Reports abdominal pain today, which is ongoing since SAB July 2024 with retained products (treated with Cytotec ). Discussed with patient that we are following beta HCG levels today. Pt will also collect vaginal self swab to rule out vaginal infection as source of ongoing pain. Pt denies any urinary symptoms.  Beta HCG results: 09/04/23 1214 83  09/06/23  1152 29   Tina Donnice HERO, MD  Honore Vernell BRAVO, RN HCG downtrending. 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 Recommend trending weekly to 0.  Called pt to review provider recommendation. Pt declines weekly visit. Does plan to attend visit with Lola, MD in 2 weeks to discuss ongoing pain, will draw HCG at that visit. Reviewed return precautions for MAU in the meantime.  Yong Grieser E Sacheen Arrasmith 09/06/2023 11:05 AM

## 2023-09-07 ENCOUNTER — Other Ambulatory Visit: Payer: Self-pay | Admitting: Family Medicine

## 2023-09-07 LAB — CERVICOVAGINAL ANCILLARY ONLY
Bacterial Vaginitis (gardnerella): POSITIVE — AB
Candida Glabrata: NEGATIVE
Candida Vaginitis: NEGATIVE
Chlamydia: NEGATIVE
Comment: NEGATIVE
Comment: NEGATIVE
Comment: NEGATIVE
Comment: NEGATIVE
Comment: NEGATIVE
Comment: NORMAL
Neisseria Gonorrhea: NEGATIVE
Trichomonas: NEGATIVE

## 2023-09-07 MED ORDER — METRONIDAZOLE 0.75 % VA GEL: 1.0000 | Freq: Every day | VAGINAL | 0 refills | Status: AC

## 2023-09-20 ENCOUNTER — Ambulatory Visit: Payer: 59 | Admitting: Family Medicine

## 2023-09-20 NOTE — Telephone Encounter (Signed)
Patient called office, unable to keep previously scheduled appointment for provider visit. Front office requested RN speak with patient about needed lab visit. Reviewed with patient the concern for ectopic pregnancy and need to check HCG level weekly until negative. Pt okay to schedule appt Thursday afternoon.

## 2023-09-21 ENCOUNTER — Encounter: Payer: Self-pay | Admitting: Family Medicine

## 2023-09-22 ENCOUNTER — Other Ambulatory Visit: Payer: 59

## 2023-09-22 ENCOUNTER — Ambulatory Visit: Payer: 59 | Admitting: Family Medicine
